# Patient Record
Sex: Male | Born: 1970 | Race: Black or African American | Hispanic: No | Smoking: Former smoker
Health system: Southern US, Community
[De-identification: ages and names within clinical notes are randomized; demographics above are authoritative.]

## PROBLEM LIST (undated history)

## (undated) ENCOUNTER — Emergency Department (HOSPITAL_COMMUNITY): Discharge status: Absent without leave | Payer: Self-pay

## (undated) ENCOUNTER — Emergency Department (HOSPITAL_COMMUNITY): Admission: EM | Payer: Medicaid Other | Source: Home / Self Care

## (undated) DIAGNOSIS — E785 Hyperlipidemia, unspecified: Secondary | ICD-10-CM

## (undated) DIAGNOSIS — I10 Essential (primary) hypertension: Secondary | ICD-10-CM

## (undated) DIAGNOSIS — F200 Paranoid schizophrenia: Secondary | ICD-10-CM

## (undated) DIAGNOSIS — H9190 Unspecified hearing loss, unspecified ear: Secondary | ICD-10-CM

## (undated) DIAGNOSIS — R519 Headache, unspecified: Secondary | ICD-10-CM

## (undated) HISTORY — DX: Headache, unspecified: R51.9

## (undated) HISTORY — DX: Essential (primary) hypertension: I10

## (undated) HISTORY — DX: Unspecified hearing loss, unspecified ear: H91.90

## (undated) HISTORY — DX: Hyperlipidemia, unspecified: E78.5

---

## 1999-08-11 ENCOUNTER — Emergency Department (HOSPITAL_COMMUNITY): Admission: EM | Admit: 1999-08-11 | Discharge: 1999-08-11 | Payer: Self-pay | Admitting: Emergency Medicine

## 2005-04-25 ENCOUNTER — Inpatient Hospital Stay (HOSPITAL_COMMUNITY): Admission: AD | Admit: 2005-04-25 | Discharge: 2005-05-05 | Payer: Self-pay | Admitting: Psychiatry

## 2005-04-26 ENCOUNTER — Ambulatory Visit: Payer: Self-pay | Admitting: Psychiatry

## 2005-09-23 ENCOUNTER — Emergency Department (HOSPITAL_COMMUNITY): Admission: EM | Admit: 2005-09-23 | Discharge: 2005-09-23 | Payer: Self-pay | Admitting: Emergency Medicine

## 2005-09-26 ENCOUNTER — Ambulatory Visit: Payer: Self-pay | Admitting: Psychiatry

## 2005-09-26 ENCOUNTER — Inpatient Hospital Stay (HOSPITAL_COMMUNITY): Admission: EM | Admit: 2005-09-26 | Discharge: 2005-09-28 | Payer: Self-pay | Admitting: Psychiatry

## 2005-10-28 ENCOUNTER — Ambulatory Visit: Payer: Self-pay | Admitting: Psychiatry

## 2005-10-28 ENCOUNTER — Inpatient Hospital Stay (HOSPITAL_COMMUNITY): Admission: RE | Admit: 2005-10-28 | Discharge: 2005-10-29 | Payer: Self-pay | Admitting: Psychiatry

## 2009-05-17 ENCOUNTER — Ambulatory Visit: Payer: Self-pay | Admitting: Internal Medicine

## 2009-08-01 ENCOUNTER — Emergency Department (HOSPITAL_COMMUNITY): Admission: EM | Admit: 2009-08-01 | Discharge: 2009-08-01 | Payer: Self-pay | Admitting: Emergency Medicine

## 2009-12-10 ENCOUNTER — Emergency Department (HOSPITAL_COMMUNITY): Admission: EM | Admit: 2009-12-10 | Discharge: 2009-12-11 | Payer: Self-pay | Admitting: Emergency Medicine

## 2009-12-11 ENCOUNTER — Inpatient Hospital Stay (HOSPITAL_COMMUNITY): Admission: RE | Admit: 2009-12-11 | Discharge: 2009-12-13 | Payer: Self-pay | Admitting: Psychiatry

## 2009-12-11 ENCOUNTER — Ambulatory Visit: Payer: Self-pay | Admitting: Psychiatry

## 2010-09-29 LAB — CBC
HCT: 46.8 % (ref 39.0–52.0)
MCV: 99.9 fL (ref 78.0–100.0)
Platelets: 237 10*3/uL (ref 150–400)
RBC: 4.68 MIL/uL (ref 4.22–5.81)
WBC: 8.6 10*3/uL (ref 4.0–10.5)

## 2010-09-29 LAB — ETHANOL: Alcohol, Ethyl (B): <5 mg/dL (ref 0–10)

## 2010-09-29 LAB — DIFFERENTIAL
Eosinophils Absolute: 0.5 10*3/uL (ref 0.0–0.7)
Eosinophils Relative: 6 % — ABNORMAL HIGH (ref 0–5)
Lymphs Abs: 1.6 10*3/uL (ref 0.7–4.0)
Monocytes Relative: 7 % (ref 3–12)

## 2010-09-29 LAB — RAPID URINE DRUG SCREEN, HOSP PERFORMED
Amphetamines: NOT DETECTED
Tetrahydrocannabinol: POSITIVE — AB

## 2010-09-29 LAB — BASIC METABOLIC PANEL
BUN: 8 mg/dL (ref 6–23)
Chloride: 98 mEq/L (ref 96–112)
Potassium: 3.7 mEq/L (ref 3.5–5.1)

## 2010-10-17 ENCOUNTER — Inpatient Hospital Stay (INDEPENDENT_AMBULATORY_CARE_PROVIDER_SITE_OTHER)
Admission: RE | Admit: 2010-10-17 | Discharge: 2010-10-17 | Disposition: A | Discharge status: Home / Self Care | Payer: Medicaid Other | Source: Ambulatory Visit | Attending: Family Medicine | Admitting: Family Medicine

## 2010-10-17 DIAGNOSIS — J069 Acute upper respiratory infection, unspecified: Secondary | ICD-10-CM
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
## 2010-11-28 NOTE — Discharge Summary (Signed)
NAMETEDD, COTTRILL NO.:  [id number]   MEDICAL RECORD NO.:  [id number]          PATIENT TYPE:  IPS   LOCATION:  0404                          FACILITY:  BH   PHYSICIAN:  Geoffery Lyons, M.D.      DATE OF BIRTH:  Dec 21, 1970   DATE OF ADMISSION:  09/26/2005  DATE OF DISCHARGE:  09/28/2005                                 DISCHARGE SUMMARY   CHIEF COMPLAINT AND PRESENT ILLNESS:  This was the first admission to [facility] for this 39 year old divorced African-American male  involuntarily committed.  He presented to the emergency department at [facility] around 6 p.m. stating that a bump on his lower lip was talking to him.  He had been seen on March 14th in the emergency department for what he was  prescribed pain medications.  At the time of the evaluation, he claimed that  he is not thinking anymore that the lip is talking to him.   PAST PSYCHIATRIC HISTORY:  Currently being followed up at [facility].   ALCOHOL/DRUG HISTORY:  Denies.   MEDICAL HISTORY:  Lesion on his lip.   MEDICATIONS:  In September of 2006, he was taking Risperdal 2 mg in the  morning, 4 at bedtime, Cogentin 1 mg twice a day and trazodone 100 mg at  bedtime.   PHYSICAL EXAMINATION:  Performed and failed to show any acute findings.   LABORATORY DATA:  Results not available in the chart.   MENTAL STATUS EXAM:  Male.  Casually dressed and groomed.  Speech was not  pressured.  Mood was anxiety.  Thought processes were clearing.  Endorsed no  longer thinking that his lip was talking to him.  Judgment and insight were  fair.  Concentration and memory were intact.  He denied any active suicidal  or homicidal ideation.   Dictation ended at this point.      Geoffery Lyons, M.D.  Electronically Signed     IL/MEDQ  D:  10/13/2005  T:  10/16/2005  Job:  [id number]

## 2010-11-28 NOTE — Discharge Summary (Signed)
[name], [name] NO.:  [id number]   MEDICAL RECORD NO.:  [id number]          PATIENT TYPE:  IPS   LOCATION:  0404                          FACILITY:  [facility]   PHYSICIAN:  Geoffery Lyons, M.D.      DATE OF BIRTH:  1970/08/02   DATE OF ADMISSION:  09/26/2005  DATE OF DISCHARGE:  09/28/2005                                 DISCHARGE SUMMARY   CHIEF COMPLAINT AND PRESENT ILLNESS:  This was the second admission to [facility] for this 40 year old divorced African-American male  who was admitted after he went to the emergency room.  He had a lower lip  lesion being treated.  He was fixated on his cold sore on his lip.  Endorsed  that he was hearing voices.  He was actively hallucinating.  He was very  labile.  The voices were sometimes good, sometimes bad.  He felt that he  was too unstable to let him go, so he was admitted to the Behavioral Health  Unit.   PAST PSYCHIATRIC HISTORY:  Diagnosed schizophrenic.  Ongoing treatment with  [facility] in [place].  Questionable compliance  with medications.   ALCOHOL/DRUG HISTORY:  Denies active use of alcohol or any other drugs.   MEDICAL HISTORY:  As already stated, lower lip lesion healing, being  treated.   MEDICATIONS:  Last prescription filled in September of 2006 which showed  Risperdal 2 mg in the morning, 4 at bedtime, Cogentin 1 mg twice a day and  trazodone 100 mg at bedtime.   PHYSICAL EXAMINATION:  Performed and failed to show any acute findings other  than scar on his lower lip.   MENTAL STATUS EXAM:  Male.  Initially drowsy.  Casually dressed and groomed.  Normal gait.  Speech was not pressured.  Mood was anxious.  Thought  processes were clearing.  No active concern about his lip.  Some  hallucinatory process.  Cognition was well-preserved.   ADMISSION DIAGNOSES:  AXIS I:  Schizophrenia, undifferentiated.  AXIS II:  No diagnosis.  AXIS III:  Lesion, lower  lip.  AXIS IV:  Moderate.  AXIS V:  GAF upon admission 35; highest GAF in the last year 60.   HOSPITAL COURSE:  He was admitted.  He was started on Seroquel and lithium  that he claimed was the medication that worked for him and he was willing to  take.  Seroquel 100 mg twice a day and at bedtime, that was gradually  increased to 100 mg twice a day and 200 mg at bedtime.  He was also given  lithium 300 mg in the morning and 600 mg at bedtime.  He admits he has been  off his medication.  Endorsed auditory hallucinations.  The voices were  telling him negative things.  Worried because he had to go to court on  Tuesday for an old charge of possession.  Worried that he would not be able  to go.  Very reserved, very guarded.  He did exhibit some threatening  behavior, wanting to get involved with different females  in the unit.  He  could be redirected.  Some females felt threatened by his approach.  Sleep  improved.  He was basically confronted with this behavior.  He denied.  On  March 19th, he was in full contact with reality.  Endorsed no  hallucinations.  Had tolerated the medications well.  We went ahead and  discharged to outpatient follow-up.   DISCHARGE DIAGNOSES:  AXIS I:  Schizoaffective disorder.  Alcohol and drug  abuse, in early partial remission.  AXIS II:  No diagnosis.  AXIS III:  No diagnosis.  AXIS IV:  Moderate.  AXIS V:  GAF upon discharge 50.   DISCHARGE MEDICATIONS:  1.  Seroquel 100 mg twice a day and 200 mg at bedtime.  2.  Lithium 300 mg in the morning and 600 mg at bedtime.  3.  Ambien 10 mg at bedtime for sleep.   FOLLOW UP:  [facility].      Geoffery Lyons, M.D.  Electronically Signed     IL/MEDQ  D:  10/13/2005  T:  10/16/2005  Job:  [id number]
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
## 2010-11-28 NOTE — H&P (Signed)
NAMESANAD, FEARNOW NO.:  [id number]   MEDICAL RECORD NO.:  [id number]          PATIENT TYPE:  IPS   LOCATION:  0404                          FACILITY:  BH   PHYSICIAN:  Jeanice Lim, M.D. DATE OF BIRTH:  1971/07/12   DATE OF ADMISSION:  04/25/2005  DATE OF DISCHARGE:                         PSYCHIATRIC ADMISSION ASSESSMENT   IDENTIFYING INFORMATION:  This is a 40 year old separated African-American  male.  He presented to the [facility] Emergency Department reporting  auditory and visual hallucinations.  He was medically cleared and admitted  to the [facility].  He is followed by Dr. Harrison Mons at [facility] in [facility].  The patient reported feeling suicidal  and homicidal due to the auditory and visual hallucinations.  His alcohol  level was 157.  He had no other drugs documented and his urine drug screen  was clean.   PAST PSYCHIATRIC HISTORY:  He has had a previous psychiatric stay at [facility] in 2003 and also in 2000 at [facility].  He is  followed as an outpatient by Marikay Alar.   SOCIAL HISTORY:  He finished high school in 1992.  He has been employed as a  Public affairs consultant, a shipping and receiving person.  He has been married once and  he has a 34-year-old son by a girlfriend.   FAMILY HISTORY:  He states his biological father had mental illness although  which type he is not sure.   ALCOHOL AND DRUG ABUSE:  He smokes all the time and denied any other drugs.  He began using alcohol at age 72.  He states he is drinking 2 to 3 beers a  day, and wine he also began using at age 3.  He drinks a cup on occasion.   PAST MEDICAL HISTORY:  He is currently prescribed Risperdal 2 mg in the  morning, 4 mg at h.s., Cogentin 1 mg b.i.d., and trazodone 100 mg at h.s. by  Dr. Harrison Mons.  The patient also purports to be taking Prozac, however when  verified and reconciled it developed that he has not been  prescribed this  since February.   ALLERGIES:  No known drug allergies.   POSITIVE PHYSICAL FINDINGS:  PHYSICAL EXAMINATION:  Reveals an African-  American male who appears his stated age of 26, in no acute distress.  His  vital signs are stable, and the remainder of his physical examination is  well documented from the ER visit.   MENTAL STATUS EXAM:  He is alert and oriented x3.  He is appropriately  groomed, dressed and adequately nourished.  His speech is not pressured.  It  is a normal rate, rhythm and tone.  His mood is appropriate to the  situation, as is his affect.  He smiles brightly today.  He states that the  voices are gone, at least for the moment.  Judgment and insight are fair,  concentration and memory are good and intelligence is average to below  average.   ADMISSION DIAGNOSES:  AXIS I:  Schizophrenia, alcohol abuse, rule out  dependence, questionable  compliance with schizophrenia medications.  AXIS II:  Deferred.  AXIS III:  None.  AXIS IV:  Moderate to severe, problems with primary support group and  housing issues.  AXIS V:  Global assessment of function is 31.   PLAN:  Plan is to re establish appropriate psychotropic medications and  compliance, to help support through withdrawal from alcohol.  Toward that  end, a low-dose Librium protocol is initiated, and his Risperdal was  restarted.      Mickie Leonarda Salon, P.A.-C.      Jeanice Lim, M.D.  Electronically Signed    MD/MEDQ  D:  04/26/2005  T:  04/26/2005  Job:  [id number]

## 2010-11-28 NOTE — H&P (Signed)
NAMESQUARE, JOWETT NO.:  [id number]   MEDICAL RECORD NO.:  [id number]          PATIENT TYPE:  IPS   LOCATION:  0404                          FACILITY:  [facility]   PHYSICIAN:  Jeanice Lim, M.D. DATE OF BIRTH:  [birth year]   DATE OF ADMISSION:  09/26/2005  DATE OF DISCHARGE:                         PSYCHIATRIC ADMISSION ASSESSMENT   IDENTIFYING INFORMATION:  This is a 40 year old divorced African-American  male.  He is here involuntarily.  Today he presented to the emergency  department at [facility] yesterday around 6 p.m. stating that a bump on his  lower lip was talking to him.  He had been seen on March 14 in the emergency  department for it and was prescribed pain medication which accounts for the  positive opiates in his urine.  Today, he is no longer delusional.  He is  not believing that his lip lesion, which is healing, is talking to him.   PAST PSYCHIATRIC HISTORY:  He is currently followed at [facility] by Willette Alma, the PA.   SOCIAL HISTORY:  He states he dropped out of high school.  He is divorced.  He has no children, and he has been getting a disability check for  approximately 6 years.   FAMILY HISTORY:  He denies.   ALCOHOL AND DRUG ABUSE:  He smokes 1 pack of cigarettes per day.  He denies  other drug use.   PAST MEDICAL HISTORY:  Primary care Chessica Audia:  He does not have one.  Medical problems:  He denies any medical problems other than this lesion  that just came up on his lip the other day.  It apparently was some kind of  a little abscess.  Medications:  We called [facility] in [facility].  He last  picked up medications in September of 2006.  At that time he had a  prescription for Risperdal 2 mg in the morning, 4 mg at h.s., Cogentin 1 mg  b.i.d., and trazodone 100 mg at h.s.   ALLERGIES:  No known drug allergies.   POSITIVE PHYSICAL FINDINGS:  PHYSICAL EXAMINATION:  His vital signs on  admission show that he  is 71 inches tall, weighs 158.  Temperature is 99.3,  blood pressure is 131/88, pulse is 72-88, and respirations are 20.  His  physical examination is remarkable for acne.  You can see a scar on his  lower lip where he had this abscess the other day.  Other than that his  physical examination is unremarkable.  His labs were unremarkable at the ED  as well.   MENTAL STATUS EXAM:  He is drowsy.  He is casually dressed and groomed.  He  had a normal gait.  His speech is not pressured.  His mood is a little bit  anxious.  Thought processes are clearing.  He is no longer delusional about  his lip at present.  Judgment and insight are fair.  Concentration and  memory are intact.  He specifically denies suicidal or homicidal ideation  and he denies auditory or visual hallucinations.   ADMISSION DIAGNOSES:  AXIS I:  Schizophrenia, noncompliant with prescribed  medications versus substance-induced mood psychosis.  AXIS II:  None.  AXIS III:  Recent lesion of lower lip.  He also has acne.  AXIS IV:  Moderate.  He reports that he has an upcoming court date this  Tuesday and Josephine Igo, the Arts administrator, is his attorney, and he is up  for drug possession charges.   PLAN:  The plan is to admit for safety and stabilization, to restart anti-  psychotic treatment as indicated, and to help him make his court date.      Mickie Leonarda Salon, P.A.-C.      Jeanice Lim, M.D.  Electronically Signed    MD/MEDQ  D:  09/26/2005  T:  09/27/2005  Job:  [id number]

## 2011-05-05 ENCOUNTER — Inpatient Hospital Stay (INDEPENDENT_AMBULATORY_CARE_PROVIDER_SITE_OTHER)
Admission: RE | Admit: 2011-05-05 | Discharge: 2011-05-05 | Disposition: A | Discharge status: Home / Self Care | Payer: Medicare Other | Source: Ambulatory Visit | Attending: Family Medicine | Admitting: Family Medicine

## 2011-05-05 ENCOUNTER — Ambulatory Visit (INDEPENDENT_AMBULATORY_CARE_PROVIDER_SITE_OTHER): Payer: PRIVATE HEALTH INSURANCE

## 2011-05-05 DIAGNOSIS — K5289 Other specified noninfective gastroenteritis and colitis: Secondary | ICD-10-CM

## 2011-05-05 LAB — DIFFERENTIAL
Basophils Absolute: 0 10*3/uL (ref 0.0–0.1)
Lymphocytes Relative: 26 % (ref 12–46)
Neutro Abs: 5.6 10*3/uL (ref 1.7–7.7)

## 2011-05-05 LAB — CBC
HCT: 44 % (ref 39.0–52.0)
Hemoglobin: 15.8 g/dL (ref 13.0–17.0)
RDW: 13 % (ref 11.5–15.5)
WBC: 9.2 10*3/uL (ref 4.0–10.5)

## 2011-05-05 LAB — POCT URINALYSIS DIP (DEVICE)
Bilirubin Urine: NEGATIVE
Glucose, UA: NEGATIVE mg/dL
Leukocytes, UA: NEGATIVE
Nitrite: NEGATIVE

## 2011-06-20 ENCOUNTER — Emergency Department (INDEPENDENT_AMBULATORY_CARE_PROVIDER_SITE_OTHER)
Admission: EM | Admit: 2011-06-20 | Discharge: 2011-06-20 | Disposition: A | Discharge status: Home / Self Care | Payer: Medicaid Other | Source: Home / Self Care | Attending: Emergency Medicine | Admitting: Emergency Medicine

## 2011-06-20 ENCOUNTER — Encounter: Payer: Self-pay | Admitting: *Deleted

## 2011-06-20 DIAGNOSIS — M6530 Trigger finger, unspecified finger: Secondary | ICD-10-CM

## 2011-06-20 HISTORY — DX: Paranoid schizophrenia: F20.0

## 2011-06-20 MED ORDER — MELOXICAM 7.5 MG PO TABS: 7.5000 mg | ORAL_TABLET | Freq: Every day | ORAL | Status: AC

## 2011-06-20 NOTE — ED Provider Notes (Addendum)
History     CSN: [id number] Arrival date & time: 06/20/2011  2:34 PM   First MD Initiated Contact with Patient 06/20/11 1458      Chief Complaint  Patient presents with  . Hand Pain    (Consider location/radiation/quality/duration/timing/severity/associated sxs/prior treatment) HPI Comments: For about 4 weeks my finger "pops" (poinst to PIP area of l middle finger), it really hurts at times and sometimes i cant move my finger"  Patient is a 40 y.o. male presenting with hand pain. The history is provided by the patient.  Hand Pain This is a recurrent problem. The current episode started more than 2 days ago. The problem occurs every several days. The problem has been gradually worsening. Exacerbated by: Moving my finger sometimes "pops" The symptoms are relieved by rest. He has tried nothing for the symptoms.    Past Medical History  Diagnosis Date  . Schizophrenia, paranoid type     No past surgical history on file.  No family history on file.  History  Substance Use Topics  . Smoking status: Current Everyday Smoker  . Smokeless tobacco: Not on file  . Alcohol Use: Yes      Review of Systems  Musculoskeletal: Positive for joint swelling.    Allergies  Review of patient's allergies indicates no known allergies.  Home Medications   Current Outpatient Rx  Name Route Sig Dispense Refill  . BENZTROPINE MESYLATE 1 MG PO TABS Oral Take 1 mg by mouth 2 (two) times daily.      Marland Kitchen RISPERIDONE 0.5 MG PO TABS Oral Take 0.5 mg by mouth 1 day or 1 dose.      Marland Kitchen RISPERIDONE MICROSPHERES 50 MG IM SUSR Intramuscular Inject 50 mg into the muscle every 14 (fourteen) days.      . MELOXICAM 7.5 MG PO TABS Oral Take 1 tablet (7.5 mg total) by mouth daily. 10 tablet 0    BP 144/76  Pulse 79  Temp(Src) 98.3 F (36.8 C) (Oral)  Resp 16  SpO2 97%  Physical Exam  Nursing note and vitals reviewed. Constitutional: He appears well-developed and well-nourished.  Musculoskeletal: He  exhibits tenderness. He exhibits no edema.       Left hand: He exhibits tenderness. He exhibits normal range of motion, no bony tenderness, normal two-point discrimination, normal capillary refill, no deformity and no swelling. normal sensation noted. Normal strength noted.       Hands: Skin: Skin is warm. No erythema.    ED Course  Procedures (including critical care time)  Labs Reviewed - No data to display No results found.   1. Trigger finger       MDM  Patient washes dishes - with trigger finger (L third finger)        Jimmie Molly, MD 06/20/11 2123  Jimmie Molly, MD 06/20/11 2124

## 2011-06-20 NOTE — ED Notes (Signed)
Pt with c/o left hand middle finger pain no known injury - onset x 2 days - worse today difficulty making fist due to pain middle finger

## 2015-08-02 ENCOUNTER — Emergency Department (HOSPITAL_COMMUNITY): Payer: Medicare Other

## 2015-08-02 ENCOUNTER — Encounter (HOSPITAL_COMMUNITY): Payer: Self-pay | Admitting: *Deleted

## 2015-08-02 ENCOUNTER — Emergency Department (HOSPITAL_COMMUNITY)
Admission: EM | Admit: 2015-08-02 | Discharge: 2015-08-03 | Disposition: A | Discharge status: Home / Self Care | Payer: Medicare Other | Attending: Emergency Medicine | Admitting: Emergency Medicine

## 2015-08-02 DIAGNOSIS — R079 Chest pain, unspecified: Secondary | ICD-10-CM | POA: Insufficient documentation

## 2015-08-02 DIAGNOSIS — Z8659 Personal history of other mental and behavioral disorders: Secondary | ICD-10-CM | POA: Diagnosis not present

## 2015-08-02 DIAGNOSIS — Z79899 Other long term (current) drug therapy: Secondary | ICD-10-CM | POA: Insufficient documentation

## 2015-08-02 DIAGNOSIS — F17200 Nicotine dependence, unspecified, uncomplicated: Secondary | ICD-10-CM | POA: Diagnosis not present

## 2015-08-02 DIAGNOSIS — R0789 Other chest pain: Secondary | ICD-10-CM | POA: Diagnosis not present

## 2015-08-02 LAB — I-STAT TROPONIN, ED: TROPONIN I, POC: 0 ng/mL (ref 0.00–0.08)

## 2015-08-02 LAB — BASIC METABOLIC PANEL
Anion gap: 13 (ref 5–15)
BUN: 12 mg/dL (ref 6–20)
CALCIUM: 9.5 mg/dL (ref 8.9–10.3)
CO2: 27 mmol/L (ref 22–32)
CREATININE: 1.06 mg/dL (ref 0.61–1.24)
Chloride: 100 mmol/L — ABNORMAL LOW (ref 101–111)
GFR calc Af Amer: >60 mL/min (ref >60)
GLUCOSE: 88 mg/dL (ref 65–99)
Potassium: 3.3 mmol/L — ABNORMAL LOW (ref 3.5–5.1)
SODIUM: 140 mmol/L (ref 135–145)

## 2015-08-02 LAB — CBC
HCT: 40.8 % (ref 39.0–52.0)
Hemoglobin: 14.4 g/dL (ref 13.0–17.0)
MCH: 33.4 pg (ref 26.0–34.0)
MCHC: 35.3 g/dL (ref 30.0–36.0)
MCV: 94.7 fL (ref 78.0–100.0)
PLATELETS: 262 10*3/uL (ref 150–400)
RBC: 4.31 MIL/uL (ref 4.22–5.81)
RDW: 13.3 % (ref 11.5–15.5)
WBC: 7.8 10*3/uL (ref 4.0–10.5)

## 2015-08-02 NOTE — ED Notes (Addendum)
Pt states he denies chest pain. States its his heart. He thinks he had two heart attacks last week and today. He thinks its from clogged arteries. Py states that he hopes it is chest pain. Denies pain but says he feels blood flowing through his veins.

## 2015-08-02 NOTE — ED Notes (Signed)
Patient transported to X-ray 

## 2015-08-02 NOTE — ED Notes (Signed)
Pt asked to describe chest pain to the writer he responded "respiratory"

## 2015-08-02 NOTE — ED Notes (Signed)
Pt states he thinks he had a heart attack last week. Pt states last week he had pain on the left side of his chest saying he "felt like his heart was about to go out." pt denies any symptoms at this time and states "I feel completely fine." Pt wants to know what happened last week so he can prevent it from happening again.

## 2015-08-03 DIAGNOSIS — R079 Chest pain, unspecified: Secondary | ICD-10-CM | POA: Diagnosis not present

## 2015-08-03 LAB — I-STAT TROPONIN, ED: TROPONIN I, POC: 0 ng/mL (ref 0.00–0.08)

## 2015-08-03 MED ORDER — OMEPRAZOLE 20 MG PO CPDR: 20.0000 mg | DELAYED_RELEASE_CAPSULE | Freq: Every day | ORAL | Status: DC

## 2015-08-03 NOTE — ED Provider Notes (Signed)
CSN: [id number]     Arrival date & time 08/02/15  2048 History  By signing my name below, I, [name], attest that this documentation has been prepared under the direction and in the presence of Jola Schmidt, MD. Electronically Signed: Altamease Hernandez, ED Scribe. 08/03/2015. 12:22 AM   Chief Complaint  Patient presents with  . Chest Pain   The history is provided by the patient. No language interpreter was used.   [name] is a 45 y.o. male with history of paranoid schizophrenia who presents to the Emergency Department complaining of intermittent, non-radiating, pressure-like chest pain with onset last week while riding the bus. The episodes last for 2-3 minutes at a time. This afternoon he had an episode of pain while washing dishes that was more severe and he thought he might fall because of it. He has not had another episode of pain since then. Pt denies SOB, nausea, vomiting, neck pain, and back pain. Pt denies history of HTN and DM. He is unsure if he has history of high cholesterol. He is a smoker. No known family history of early MI.   Past Medical History  Diagnosis Date  . Schizophrenia, paranoid type ([place])    History reviewed. No pertinent past surgical history. History reviewed. No pertinent family history. Social History  Substance Use Topics  . Smoking status: Current Every Day Smoker  . Smokeless tobacco: None  . Alcohol Use: Yes    Review of Systems 10 Systems reviewed and all are negative for acute change except as noted in the HPI. Allergies  Review of patient's allergies indicates no known allergies.  Home Medications   Prior to Admission medications   Medication Sig Start Date End Date Taking? Authorizing Provider  benztropine (COGENTIN) 1 MG tablet Take 1 mg by mouth 2 (two) times daily.     Yes Historical Provider, MD  omeprazole (PRILOSEC) 20 MG capsule Take 1 capsule (20 mg total) by mouth daily. 08/03/15   Jola Schmidt, MD   BP 159/100 mmHg   Pulse 69  Temp(Src) 97.9 F (36.6 C) (Oral)  Resp 20  SpO2 100% Physical Exam  Constitutional: He is oriented to person, place, and time. He appears well-developed and well-nourished.  HENT:  Head: Normocephalic and atraumatic.  Eyes: EOM are normal.  Neck: Normal range of motion.  Cardiovascular: Normal rate, regular rhythm, normal heart sounds and intact distal pulses.   Pulmonary/Chest: Effort normal and breath sounds normal. No respiratory distress.  Abdominal: Soft. He exhibits no distension. There is no tenderness.  Musculoskeletal: Normal range of motion.  Neurological: He is alert and oriented to person, place, and time.  Skin: Skin is warm and dry.  Psychiatric: He has a normal mood and affect. Judgment normal.  Nursing note and vitals reviewed.   ED Course  Procedures (including critical care time) DIAGNOSTIC STUDIES: Oxygen Saturation is 100% on RA,  normal by my interpretation.    COORDINATION OF CARE: 12:19 AM Discussed treatment plan which includes lab work, CXR, and EKG with pt at bedside and pt agreed to plan.  Labs Review Labs Reviewed  BASIC METABOLIC PANEL - Abnormal; Notable for the following:    Potassium 3.3 (*)    Chloride 100 (*)    All other components within normal limits  CBC  I-STAT TROPOININ, ED  Randolm Idol, ED    Imaging Review Dg Chest 2 View  08/02/2015  CLINICAL DATA:  45 year old male with chest pain EXAM: CHEST  2 VIEW COMPARISON:  None. FINDINGS: The heart size and mediastinal contours are within normal limits. Both lungs are clear. The visualized skeletal structures are unremarkable. IMPRESSION: No active cardiopulmonary disease. Electronically Signed   By: Anner Crete M.D.   On: 08/02/2015 21:40   I have personally reviewed and evaluated these images and lab results as part of my medical decision-making.   EKG Interpretation   Date/Time:  Friday August 02 2015 20:50:24 EST Ventricular Rate:  65 PR Interval:   126 QRS Duration: 90 QT Interval:  394 QTC Calculation: 409 R Axis:   72 Text Interpretation:  Normal sinus rhythm Normal ECG No significant change  was found Confirmed by Kaimen Peine  MD, Brandolyn Shortridge ([id number]) on 08/03/2015 12:10:21 AM      MDM   Final diagnoses:  Chest pain, unspecified chest pain type    Patient is overall well-appearing.  He's had intermittent chest pain for years.  This is very nonspecific.  EKG is without ischemic changes.  Troponin is negative 2.  Chest x-ray and labs without other significant abnormality.  May represent gastroesophageal reflux disease.  Patient be started on daily Prilosec.  He understands to return to the ER for new or worsening symptoms.  I recommended he obtain follow-up with a primary care physician.  Asked that he stop smoking cigarettes.  I personally performed the services described in this documentation, which was scribed in my presence. The recorded information has been reviewed and is accurate.       Jola Schmidt, MD 08/03/15 [phone]

## 2015-08-03 NOTE — Discharge Instructions (Signed)
Nonspecific Chest Pain  °Chest pain can be caused by many different conditions. There is always a chance that your pain could be related to something serious, such as a heart attack or a blood clot in your lungs. Chest pain can also be caused by conditions that are not life-threatening. If you have chest pain, it is very important to follow up with your health care provider. °CAUSES  °Chest pain can be caused by: °· Heartburn. °· Pneumonia or bronchitis. °· Anxiety or stress. °· Inflammation around your heart (pericarditis) or lung (pleuritis or pleurisy). °· A blood clot in your lung. °· A collapsed lung (pneumothorax). It can develop suddenly on its own (spontaneous pneumothorax) or from trauma to the chest. °· Shingles infection (varicella-zoster virus). °· Heart attack. °· Damage to the bones, muscles, and cartilage that make up your chest wall. This can include: °¨ Bruised bones due to injury. °¨ Strained muscles or cartilage due to frequent or repeated coughing or overwork. °¨ Fracture to one or more ribs. °¨ Sore cartilage due to inflammation (costochondritis). °RISK FACTORS  °Risk factors for chest pain may include: °· Activities that increase your risk for trauma or injury to your chest. °· Respiratory infections or conditions that cause frequent coughing. °· Medical conditions or overeating that can cause heartburn. °· Heart disease or family history of heart disease. °· Conditions or health behaviors that increase your risk of developing a blood clot. °· Having had chicken pox (varicella zoster). °SIGNS AND SYMPTOMS °Chest pain can feel like: °· Burning or tingling on the surface of your chest or deep in your chest. °· Crushing, pressure, aching, or squeezing pain. °· Dull or sharp pain that is worse when you move, cough, or take a deep breath. °· Pain that is also felt in your back, neck, shoulder, or arm, or pain that spreads to any of these areas. °Your chest pain may come and go, or it may stay  constant. °DIAGNOSIS °Lab tests or other studies may be needed to find the cause of your pain. Your health care provider may have you take a test called an ambulatory ECG (electrocardiogram). An ECG records your heartbeat patterns at the time the test is performed. You may also have other tests, such as: °· Transthoracic echocardiogram (TTE). During echocardiography, sound waves are used to create a picture of all of the heart structures and to look at how blood flows through your heart. °· Transesophageal echocardiogram (TEE). This is a more advanced imaging test that obtains images from inside your body. It allows your health care provider to see your heart in finer detail. °· Cardiac monitoring. This allows your health care provider to monitor your heart rate and rhythm in real time. °· Holter monitor. This is a portable device that records your heartbeat and can help to diagnose abnormal heartbeats. It allows your health care provider to track your heart activity for several days, if needed. °· Stress tests. These can be done through exercise or by taking medicine that makes your heart beat more quickly. °· Blood tests. °· Imaging tests. °TREATMENT  °Your treatment depends on what is causing your chest pain. Treatment may include: °· Medicines. These may include: °¨ Acid blockers for heartburn. °¨ Anti-inflammatory medicine. °¨ Pain medicine for inflammatory conditions. °¨ Antibiotic medicine, if an infection is present. °¨ Medicines to dissolve blood clots. °¨ Medicines to treat coronary artery disease. °· Supportive care for conditions that do not require medicines. This may include: °¨ Resting. °¨ Applying heat   or cold packs to injured areas. °¨ Limiting activities until pain decreases. °HOME CARE INSTRUCTIONS °· If you were prescribed an antibiotic medicine, finish it all even if you start to feel better. °· Avoid any activities that bring on chest pain. °· Do not use any tobacco products, including  cigarettes, chewing tobacco, or electronic cigarettes. If you need help quitting, ask your health care provider. °· Do not drink alcohol. °· Take medicines only as directed by your health care provider. °· Keep all follow-up visits as directed by your health care provider. This is important. This includes any further testing if your chest pain does not go away. °· If heartburn is the cause for your chest pain, you may be told to keep your head raised (elevated) while sleeping. This reduces the chance that acid will go from your stomach into your esophagus. °· Make lifestyle changes as directed by your health care provider. These may include: °¨ Getting regular exercise. Ask your health care provider to suggest some activities that are safe for you. °¨ Eating a heart-healthy diet. A registered dietitian can help you to learn healthy eating options. °¨ Maintaining a healthy weight. °¨ Managing diabetes, if necessary. °¨ Reducing stress. °SEEK MEDICAL CARE IF: °· Your chest pain does not go away after treatment. °· You have a rash with blisters on your chest. °· You have a fever. °SEEK IMMEDIATE MEDICAL CARE IF:  °· Your chest pain is worse. °· You have an increasing cough, or you cough up blood. °· You have severe abdominal pain. °· You have severe weakness. °· You faint. °· You have chills. °· You have sudden, unexplained chest discomfort. °· You have sudden, unexplained discomfort in your arms, back, neck, or jaw. °· You have shortness of breath at any time. °· You suddenly start to sweat, or your skin gets clammy. °· You feel nauseous or you vomit. °· You suddenly feel light-headed or dizzy. °· Your heart begins to beat quickly, or it feels like it is skipping beats. °These symptoms may represent a serious problem that is an emergency. Do not wait to see if the symptoms will go away. Get medical help right away. Call your local emergency services (911 in the [place] Do not drive yourself to the hospital. °  °This  information is not intended to replace advice given to you by your health care provider. Make sure you discuss any questions you have with your health care provider. °  °Document Released: 04/08/2005 Document Revised: 07/20/2014 Document Reviewed: 02/02/2014 °Elsevier Interactive Patient Education ©2016 [employer] ° °

## 2017-12-23 DIAGNOSIS — M130 Polyarthritis, unspecified: Secondary | ICD-10-CM | POA: Diagnosis not present

## 2017-12-23 DIAGNOSIS — Z833 Family history of diabetes mellitus: Secondary | ICD-10-CM | POA: Diagnosis not present

## 2017-12-29 ENCOUNTER — Other Ambulatory Visit: Payer: Self-pay | Admitting: Family Medicine

## 2017-12-29 ENCOUNTER — Ambulatory Visit
Admission: RE | Admit: 2017-12-29 | Discharge: 2017-12-29 | Disposition: A | Discharge status: Home / Self Care | Payer: PRIVATE HEALTH INSURANCE | Source: Ambulatory Visit | Attending: Family Medicine | Admitting: Family Medicine

## 2017-12-29 DIAGNOSIS — M130 Polyarthritis, unspecified: Secondary | ICD-10-CM

## 2017-12-29 DIAGNOSIS — M25562 Pain in left knee: Secondary | ICD-10-CM | POA: Diagnosis not present

## 2017-12-29 DIAGNOSIS — M25561 Pain in right knee: Secondary | ICD-10-CM | POA: Diagnosis not present

## 2017-12-30 DIAGNOSIS — E782 Mixed hyperlipidemia: Secondary | ICD-10-CM | POA: Diagnosis not present

## 2017-12-30 DIAGNOSIS — M130 Polyarthritis, unspecified: Secondary | ICD-10-CM | POA: Diagnosis not present

## 2018-01-27 DIAGNOSIS — Z23 Encounter for immunization: Secondary | ICD-10-CM | POA: Diagnosis not present

## 2018-03-25 DIAGNOSIS — E782 Mixed hyperlipidemia: Secondary | ICD-10-CM | POA: Diagnosis not present

## 2018-03-25 DIAGNOSIS — I10 Essential (primary) hypertension: Secondary | ICD-10-CM | POA: Diagnosis not present

## 2018-04-25 DIAGNOSIS — I10 Essential (primary) hypertension: Secondary | ICD-10-CM | POA: Diagnosis not present

## 2018-04-25 DIAGNOSIS — Z833 Family history of diabetes mellitus: Secondary | ICD-10-CM | POA: Diagnosis not present

## 2018-06-27 DIAGNOSIS — I10 Essential (primary) hypertension: Secondary | ICD-10-CM | POA: Diagnosis not present

## 2018-10-27 DIAGNOSIS — E782 Mixed hyperlipidemia: Secondary | ICD-10-CM | POA: Diagnosis not present

## 2018-10-27 DIAGNOSIS — I10 Essential (primary) hypertension: Secondary | ICD-10-CM | POA: Diagnosis not present

## 2018-10-27 DIAGNOSIS — M130 Polyarthritis, unspecified: Secondary | ICD-10-CM | POA: Diagnosis not present

## 2018-12-27 DIAGNOSIS — E782 Mixed hyperlipidemia: Secondary | ICD-10-CM | POA: Diagnosis not present

## 2018-12-27 DIAGNOSIS — I10 Essential (primary) hypertension: Secondary | ICD-10-CM | POA: Diagnosis not present

## 2018-12-27 DIAGNOSIS — R7309 Other abnormal glucose: Secondary | ICD-10-CM | POA: Diagnosis not present

## 2019-01-12 DIAGNOSIS — M130 Polyarthritis, unspecified: Secondary | ICD-10-CM | POA: Diagnosis not present

## 2019-01-12 DIAGNOSIS — E782 Mixed hyperlipidemia: Secondary | ICD-10-CM | POA: Diagnosis not present

## 2019-01-12 DIAGNOSIS — I10 Essential (primary) hypertension: Secondary | ICD-10-CM | POA: Diagnosis not present

## 2019-01-27 ENCOUNTER — Other Ambulatory Visit: Payer: Self-pay

## 2019-01-27 NOTE — Patient Outreach (Signed)
[facility]) Care Management  01/27/2019  [name] 1971-06-24 [id number]   Medication Adherence call to [name] [facility]ify spoke with patient he explain he is taking Rosuvastatin 10 mg daily patient explain he needs the medication and if we can call [facility] an [facility] for patint to pick up. [name] is showing past due under [place].   [facility] Direct Dial [phone]  Fax [phone] [name]

## 2019-03-15 DIAGNOSIS — I10 Essential (primary) hypertension: Secondary | ICD-10-CM | POA: Diagnosis not present

## 2019-03-15 DIAGNOSIS — E782 Mixed hyperlipidemia: Secondary | ICD-10-CM | POA: Diagnosis not present

## 2019-03-16 ENCOUNTER — Emergency Department (HOSPITAL_COMMUNITY)
Admission: EM | Admit: 2019-03-16 | Discharge: 2019-03-16 | Discharge status: Against Medical Advice | Payer: Medicare Other | Attending: Emergency Medicine | Admitting: Emergency Medicine

## 2019-03-16 ENCOUNTER — Other Ambulatory Visit: Payer: Self-pay

## 2019-03-16 DIAGNOSIS — Y999 Unspecified external cause status: Secondary | ICD-10-CM | POA: Diagnosis not present

## 2019-03-16 DIAGNOSIS — Z5321 Procedure and treatment not carried out due to patient leaving prior to being seen by health care provider: Secondary | ICD-10-CM | POA: Insufficient documentation

## 2019-03-16 DIAGNOSIS — Y929 Unspecified place or not applicable: Secondary | ICD-10-CM | POA: Insufficient documentation

## 2019-03-16 DIAGNOSIS — S50861A Insect bite (nonvenomous) of right forearm, initial encounter: Secondary | ICD-10-CM | POA: Diagnosis present

## 2019-03-16 DIAGNOSIS — Y939 Activity, unspecified: Secondary | ICD-10-CM | POA: Insufficient documentation

## 2019-03-16 DIAGNOSIS — W57XXXA Bitten or stung by nonvenomous insect and other nonvenomous arthropods, initial encounter: Secondary | ICD-10-CM | POA: Insufficient documentation

## 2019-03-16 NOTE — ED Triage Notes (Signed)
Pt noticed an insect bite on his right arm today. Pt says it is stinging. No obvious swelling or bite that can be seen.

## 2019-04-19 DIAGNOSIS — Z23 Encounter for immunization: Secondary | ICD-10-CM | POA: Diagnosis not present

## 2019-04-19 DIAGNOSIS — I10 Essential (primary) hypertension: Secondary | ICD-10-CM | POA: Diagnosis not present

## 2019-04-19 DIAGNOSIS — E782 Mixed hyperlipidemia: Secondary | ICD-10-CM | POA: Diagnosis not present

## 2019-04-19 DIAGNOSIS — Z0000 Encounter for general adult medical examination without abnormal findings: Secondary | ICD-10-CM | POA: Diagnosis not present

## 2019-07-20 DIAGNOSIS — I10 Essential (primary) hypertension: Secondary | ICD-10-CM | POA: Diagnosis not present

## 2019-10-20 DIAGNOSIS — M130 Polyarthritis, unspecified: Secondary | ICD-10-CM | POA: Diagnosis not present

## 2019-10-20 DIAGNOSIS — H918X3 Other specified hearing loss, bilateral: Secondary | ICD-10-CM | POA: Diagnosis not present

## 2019-10-20 DIAGNOSIS — J301 Allergic rhinitis due to pollen: Secondary | ICD-10-CM | POA: Diagnosis not present

## 2019-10-20 DIAGNOSIS — I10 Essential (primary) hypertension: Secondary | ICD-10-CM | POA: Diagnosis not present

## 2019-10-20 DIAGNOSIS — E782 Mixed hyperlipidemia: Secondary | ICD-10-CM | POA: Diagnosis not present

## 2019-12-04 DIAGNOSIS — H906 Mixed conductive and sensorineural hearing loss, bilateral: Secondary | ICD-10-CM | POA: Diagnosis not present

## 2019-12-04 DIAGNOSIS — H6983 Other specified disorders of Eustachian tube, bilateral: Secondary | ICD-10-CM | POA: Diagnosis not present

## 2019-12-04 DIAGNOSIS — H6533 Chronic mucoid otitis media, bilateral: Secondary | ICD-10-CM | POA: Diagnosis not present

## 2019-12-15 DIAGNOSIS — H918X3 Other specified hearing loss, bilateral: Secondary | ICD-10-CM | POA: Diagnosis not present

## 2019-12-15 DIAGNOSIS — I10 Essential (primary) hypertension: Secondary | ICD-10-CM | POA: Diagnosis not present

## 2019-12-15 DIAGNOSIS — E782 Mixed hyperlipidemia: Secondary | ICD-10-CM | POA: Diagnosis not present

## 2020-01-04 DIAGNOSIS — H6533 Chronic mucoid otitis media, bilateral: Secondary | ICD-10-CM | POA: Diagnosis not present

## 2020-03-12 DIAGNOSIS — E7849 Other hyperlipidemia: Secondary | ICD-10-CM | POA: Diagnosis not present

## 2020-03-12 DIAGNOSIS — I10 Essential (primary) hypertension: Secondary | ICD-10-CM | POA: Diagnosis not present

## 2020-04-03 DIAGNOSIS — E559 Vitamin D deficiency, unspecified: Secondary | ICD-10-CM | POA: Diagnosis not present

## 2020-04-03 DIAGNOSIS — E785 Hyperlipidemia, unspecified: Secondary | ICD-10-CM | POA: Diagnosis not present

## 2020-04-03 DIAGNOSIS — I10 Essential (primary) hypertension: Secondary | ICD-10-CM | POA: Diagnosis not present

## 2020-04-03 DIAGNOSIS — Z79899 Other long term (current) drug therapy: Secondary | ICD-10-CM | POA: Diagnosis not present

## 2020-04-15 ENCOUNTER — Emergency Department (HOSPITAL_COMMUNITY)
Admission: EM | Admit: 2020-04-15 | Discharge: 2020-04-15 | Disposition: A | Discharge status: Home / Self Care | Payer: Medicare Other | Attending: Emergency Medicine | Admitting: Emergency Medicine

## 2020-04-15 ENCOUNTER — Encounter (HOSPITAL_COMMUNITY): Payer: Self-pay

## 2020-04-15 ENCOUNTER — Other Ambulatory Visit: Payer: Self-pay

## 2020-04-15 DIAGNOSIS — Z5321 Procedure and treatment not carried out due to patient leaving prior to being seen by health care provider: Secondary | ICD-10-CM | POA: Insufficient documentation

## 2020-04-15 DIAGNOSIS — R519 Headache, unspecified: Secondary | ICD-10-CM | POA: Insufficient documentation

## 2020-04-15 DIAGNOSIS — R0981 Nasal congestion: Secondary | ICD-10-CM | POA: Diagnosis not present

## 2020-04-15 NOTE — ED Notes (Signed)
Called this pt several times for vital recheck and no answer. Pt pulled off the floor.

## 2020-04-15 NOTE — ED Triage Notes (Addendum)
Pt presents with a headache and nasal congestion x2 months. Pt reports increase nasal draining to Right nose, Left side is clear. Taking OTC meds with no relief

## 2020-06-20 ENCOUNTER — Other Ambulatory Visit: Payer: Self-pay

## 2020-06-20 ENCOUNTER — Encounter (HOSPITAL_COMMUNITY): Payer: Self-pay | Admitting: Emergency Medicine

## 2020-06-20 ENCOUNTER — Ambulatory Visit (HOSPITAL_COMMUNITY)
Admission: EM | Admit: 2020-06-20 | Discharge: 2020-06-20 | Disposition: A | Discharge status: Home / Self Care | Payer: Medicare Other

## 2020-06-20 DIAGNOSIS — J3089 Other allergic rhinitis: Secondary | ICD-10-CM | POA: Diagnosis not present

## 2020-06-20 DIAGNOSIS — R519 Headache, unspecified: Secondary | ICD-10-CM | POA: Diagnosis not present

## 2020-06-20 MED ORDER — FLUTICASONE PROPIONATE 50 MCG/ACT NA SUSP: 1.0000 | Freq: Every day | NASAL | 1 refills | Status: DC

## 2020-06-20 MED ORDER — PREDNISONE 20 MG PO TABS: 40.0000 mg | ORAL_TABLET | Freq: Every day | ORAL | 0 refills | Status: DC

## 2020-06-20 MED ORDER — CETIRIZINE HCL 10 MG PO TABS: 10.0000 mg | ORAL_TABLET | Freq: Every day | ORAL | 1 refills | Status: DC

## 2020-06-20 NOTE — ED Triage Notes (Signed)
Pt presents with headaches xs 2-3 months. States nasal congestion has been on and off xs 2-3 months. States ibuprofen and aspirin give relief for about 2 hours.

## 2020-06-20 NOTE — ED Provider Notes (Signed)
[place]    CSN: [id number] Arrival date & time: 06/20/20  1321      History   Chief Complaint Chief Complaint  Patient presents with  . Headache    HPI [name] is a 49 y.o. male.   Here today with 2-3 months of daily frontal headaches, congestion. States the headaches come and go, sometimes triggered by smoking or being outdoors. Has been treated in the past few months with antibiotics, flonase, ibuprofen which did not permanently resolve issue. Has not taken anything for sxs in over a month. Denies fever, chills, dizziness, visual changes, mental status changes, CP, SOB, N/V/D. Does have a hx of similar headaches off and on in the past. Sees PCP next week for follow up on this.      Past Medical History:  Diagnosis Date  . Schizophrenia, paranoid type ([place])     There are no problems to display for this patient.   History reviewed. No pertinent surgical history.     Home Medications    Prior to Admission medications   Medication Sig Start Date End Date Taking? Authorizing Provider  losartan-hydrochlorothiazide (HYZAAR) 50-12.5 MG tablet Take by mouth. 11/29/19  Yes [provider]  risperiDONE microspheres (RISPERDAL CONSTA) 50 MG injection Inject into the muscle. 11/16/19  Yes [provider]  atorvastatin (LIPITOR) 10 MG tablet Take 10 mg by mouth daily. 05/30/20   [provider]  benztropine (COGENTIN) 1 MG tablet Take 1 mg by mouth 2 (two) times daily.      [provider]  cetirizine (ZYRTEC ALLERGY) 10 MG tablet Take 1 tablet (10 mg total) by mouth daily. 06/20/20   Volney American, PA-C  fluticasone [facility]) 50 MCG/ACT nasal spray Place 1 spray into both nostrils daily. 06/20/20   Volney American, PA-C  montelukast (SINGULAIR) 10 MG tablet Take 10 mg by mouth daily. 05/30/20   [provider]  omeprazole (PRILOSEC) 20 MG capsule Take 1 capsule (20 mg total) by mouth daily. 08/03/15    Jola Schmidt, MD  predniSONE (DELTASONE) 20 MG tablet Take 2 tablets (40 mg total) by mouth daily with breakfast. 06/20/20   Volney American, PA-C    Family History History reviewed. No pertinent family history.  Social History Social History   Tobacco Use  . Smoking status: Current Every Day Smoker  . Smokeless tobacco: Never Used  Substance Use Topics  . Alcohol use: Yes  . Drug use: No     Allergies   Patient has no known allergies.   Review of Systems Review of Systems PER HPI    Physical Exam Triage Vital Signs ED Triage Vitals  Enc Vitals Group     BP 06/20/20 1411 (!) 144/88     Pulse Rate 06/20/20 1411 82     Resp 06/20/20 1411 18     Temp 06/20/20 1411 98.6 F (37 C)     Temp Source 06/20/20 1411 Oral     SpO2 06/20/20 1411 96 %     Weight --      Height --      Head Circumference --      Peak Flow --      Pain Score 06/20/20 1408 0     Pain Loc --      Pain Edu? --      Excl. in [place]? --    No data found.  Updated Vital Signs BP (!) 144/88 (BP Location: Right Arm)   Pulse  82   Temp 98.6 F (37 C) (Oral)   Resp 18   SpO2 96%   Visual Acuity Right Eye Distance:   Left Eye Distance:   Bilateral Distance:    Right Eye Near:   Left Eye Near:    Bilateral Near:     Physical Exam Vitals and nursing note reviewed.  Constitutional:      Appearance: Normal appearance.  HENT:     Head: Atraumatic.     Ears:     Comments: B/l middle ear effusion    Nose: Rhinorrhea present.     Comments: B/l nasal turbinates boggy and erythematous    Mouth/Throat:     Mouth: Mucous membranes are moist.     Pharynx: Posterior oropharyngeal erythema present. No oropharyngeal exudate.  Eyes:     Extraocular Movements: Extraocular movements intact.     Conjunctiva/sclera: Conjunctivae normal.     Pupils: Pupils are equal, round, and reactive to light.  Cardiovascular:     Rate and Rhythm: Normal rate and regular rhythm.  Pulmonary:     Effort:  Pulmonary effort is normal.     Breath sounds: Normal breath sounds.  Musculoskeletal:        General: Normal range of motion.     Cervical back: Normal range of motion and neck supple.  Skin:    General: Skin is warm and dry.  Neurological:     General: No focal deficit present.     Mental Status: He is oriented to person, place, and time.     Sensory: No sensory deficit.     Motor: No weakness.     Gait: Gait normal.  Psychiatric:        Mood and Affect: Mood normal.        Thought Content: Thought content normal.        Judgment: Judgment normal.      UC Treatments / Results  Labs (all labs ordered are listed, but only abnormal results are displayed) Labs Reviewed - No data to display  EKG   Radiology No results found.  Procedures Procedures (including critical care time)  Medications Ordered in UC Medications - No data to display  Initial Impression / Assessment and Plan / UC Course  I have reviewed the triage vital signs and the nursing notes.  Pertinent labs & imaging results that were available during my care of the patient were reviewed by me and considered in my medical decision making (see chart for details).     Suspect his headaches are coming from allergy inflammation, sinus congestion. Tx with prednisone burst, flonase, zyrtec regimen. Sinus rinses, humidifier, OTC pain relievers. F/u as scheduled next week with PCP for recheck. Return precautions reviewed for worsening sxs.   Final Clinical Impressions(s) / UC Diagnoses   Final diagnoses:  Sinus headache  Seasonal allergic rhinitis due to other allergic trigger   Discharge Instructions   None    ED Prescriptions    Medication Sig Dispense Auth. Provider   cetirizine (ZYRTEC ALLERGY) 10 MG tablet Take 1 tablet (10 mg total) by mouth daily. 30 tablet Volney American, PA-C   fluticasone [facility]) 50 MCG/ACT nasal spray Place 1 spray into both nostrils daily. 16 g Volney American,  [place]   predniSONE (DELTASONE) 20 MG tablet Take 2 tablets (40 mg total) by mouth daily with breakfast. 10 tablet Volney American, [place]     PDMP not reviewed this encounter.   Volney American, [place] 06/20/20 [phone]

## 2020-10-02 ENCOUNTER — Ambulatory Visit (HOSPITAL_COMMUNITY)
Admission: EM | Admit: 2020-10-02 | Discharge: 2020-10-02 | Disposition: A | Discharge status: Home / Self Care | Payer: Medicare Other

## 2020-10-02 ENCOUNTER — Other Ambulatory Visit: Payer: Self-pay

## 2020-10-02 ENCOUNTER — Encounter (HOSPITAL_COMMUNITY): Payer: Self-pay

## 2020-10-02 ENCOUNTER — Emergency Department (HOSPITAL_COMMUNITY): Payer: Medicare Other

## 2020-10-02 ENCOUNTER — Emergency Department (HOSPITAL_COMMUNITY)
Admission: EM | Admit: 2020-10-02 | Discharge: 2020-10-02 | Disposition: A | Discharge status: Home / Self Care | Payer: Medicare Other | Attending: Emergency Medicine | Admitting: Emergency Medicine

## 2020-10-02 DIAGNOSIS — G4485 Primary stabbing headache: Secondary | ICD-10-CM | POA: Diagnosis not present

## 2020-10-02 DIAGNOSIS — R519 Headache, unspecified: Secondary | ICD-10-CM | POA: Insufficient documentation

## 2020-10-02 DIAGNOSIS — Z5321 Procedure and treatment not carried out due to patient leaving prior to being seen by health care provider: Secondary | ICD-10-CM | POA: Diagnosis not present

## 2020-10-02 NOTE — ED Triage Notes (Signed)
Presents with headache and  right eye pain  since Nov 2021. Denies dizziness, vision changes.

## 2020-10-02 NOTE — Discharge Instructions (Addendum)
Please either follow up with your PCP this week to discuss work up for your headache or go to the emergency room should symptoms worsen

## 2020-10-02 NOTE — ED Triage Notes (Signed)
Patient complains of daily headache and worsening the past few days x 3 months. States that he has been treated for sinusitis but doesn't help the head pain. Alert and oriented

## 2020-10-02 NOTE — ED Provider Notes (Signed)
[place]    CSN: [id number] Arrival date & time: 10/02/20  4580      History   Chief Complaint Chief Complaint  Patient presents with  . Headache    HPI [name] is a 50 y.o. male.   HPI   Headache: Pt states that since November he has had a headache of the right side around his right eye along with eye pain.  Previously was not getting headaches. Headache feels like a stabbing throbbing pain. Worse after he smokes a cigarette. Has been taking aleve and aspirin daily for symptoms without relief. No known trauma or injury to this area. No known visual changes. He is not UTD with his eye visits but does not have a history of glaucoma. No vomiting, neck stiffness, hearing changes. He has not had symptoms evaluated yet but wishes to have a full work up including imaging today.   Past Medical History:  Diagnosis Date  . Schizophrenia, paranoid type ([place])     There are no problems to display for this patient.   History reviewed. No pertinent surgical history.     Home Medications    Prior to Admission medications   Medication Sig Start Date End Date Taking? Authorizing Provider  atorvastatin (LIPITOR) 10 MG tablet Take 10 mg by mouth daily. 05/30/20   [provider]  benztropine (COGENTIN) 1 MG tablet Take 1 mg by mouth 2 (two) times daily.      [provider]  cetirizine (ZYRTEC ALLERGY) 10 MG tablet Take 1 tablet (10 mg total) by mouth daily. 06/20/20   Volney American, PA-C  fluticasone [facility]) 50 MCG/ACT nasal spray Place 1 spray into both nostrils daily. 06/20/20   Volney American, PA-C  losartan-hydrochlorothiazide (HYZAAR) 50-12.5 MG tablet Take by mouth. 11/29/19   [provider]  montelukast (SINGULAIR) 10 MG tablet Take 10 mg by mouth daily. 05/30/20   [provider]  omeprazole (PRILOSEC) 20 MG capsule Take 1 capsule (20 mg total) by mouth daily. 08/03/15   Jola Schmidt, MD  predniSONE  (DELTASONE) 20 MG tablet Take 2 tablets (40 mg total) by mouth daily with breakfast. 06/20/20   Volney American, PA-C  risperiDONE microspheres (RISPERDAL CONSTA) 50 MG injection Inject into the muscle. 11/16/19   [provider]    Family History History reviewed. No pertinent family history.  Social History Social History   Tobacco Use  . Smoking status: Current Every Day Smoker  . Smokeless tobacco: Never Used  Substance Use Topics  . Alcohol use: Yes  . Drug use: No     Allergies   Patient has no known allergies.   Review of Systems Review of Systems  As stated above in HPI Physical Exam Triage Vital Signs ED Triage Vitals  Enc Vitals Group     BP 10/02/20 0945 (!) 156/91     Pulse Rate 10/02/20 0945 70     Resp 10/02/20 0945 (!) 23     Temp 10/02/20 0945 97.6 F (36.4 C)     Temp Source 10/02/20 0945 Oral     SpO2 10/02/20 0945 98 %     Weight --      Height --      Head Circumference --      Peak Flow --      Pain Score 10/02/20 0944 6     Pain Loc --      Pain Edu? --      Excl. in  [place]? --    No data found.  Updated Vital Signs BP (!) 156/91 (BP Location: Left Arm)   Pulse 70   Temp 97.6 F (36.4 C) (Oral)   Resp (!) 23   SpO2 98%   Physical Exam Vitals and nursing note reviewed.  Constitutional:      General: He is not in acute distress.    Appearance: He is well-developed. He is not ill-appearing, toxic-appearing or diaphoretic.  HENT:     Head: Normocephalic and atraumatic.     Mouth/Throat:     Mouth: Mucous membranes are moist.  Eyes:     General: No visual field deficit.    Extraocular Movements: Extraocular movements intact.     Right eye: Normal extraocular motion and no nystagmus.     Left eye: Normal extraocular motion and no nystagmus.     Pupils: Pupils are equal, round, and reactive to light. Pupils are equal.     Right eye: Pupil is round and reactive.     Left eye: Pupil is round and reactive.  Cardiovascular:      Rate and Rhythm: Normal rate and regular rhythm.  Pulmonary:     Breath sounds: Normal breath sounds.  Musculoskeletal:        General: Normal range of motion.     Cervical back: Normal range of motion and neck supple. No rigidity.  Skin:    General: Skin is warm.  Neurological:     Mental Status: He is alert and oriented to person, place, and time.     Cranial Nerves: No cranial nerve deficit or facial asymmetry.     Sensory: No sensory deficit.     Motor: No weakness.     Coordination: Romberg sign negative. Coordination normal.     Gait: Gait normal.  Psychiatric:        Mood and Affect: Mood normal.        Behavior: Behavior normal.      UC Treatments / Results  Labs (all labs ordered are listed, but only abnormal results are displayed) Labs Reviewed - No data to display  EKG   Radiology No results found.  Procedures Procedures (including critical care time)  Medications Ordered in UC Medications - No data to display  Initial Impression / Assessment and Plan / UC Course  I have reviewed the triage vital signs and the nursing notes.  Pertinent labs & imaging results that were available during my care of the patient were reviewed by me and considered in my medical decision making (see chart for details).     New to me. I discussed with patient that he will need further work up. Concerning symptoms as a new headache in a patient who does not have a history of headaches and who has had an intractable headache since November. Strongly suspecting abnormal findings on imaging. Discussed rebound headaches. Likely PCP can handle as he appears neurologically intact however patient would like to go to the ER to be evaluated further.    Final Clinical Impressions(s) / UC Diagnoses   Final diagnoses:  None   Discharge Instructions   None    ED Prescriptions    None     PDMP not reviewed this encounter.   Hughie Closs, [place] 10/02/20 1028

## 2020-10-02 NOTE — ED Notes (Signed)
Pt left even after I explained he was next. Pt did not want to wait.

## 2020-10-29 ENCOUNTER — Other Ambulatory Visit: Payer: Self-pay

## 2020-10-29 ENCOUNTER — Ambulatory Visit (HOSPITAL_COMMUNITY)
Admission: EM | Admit: 2020-10-29 | Discharge: 2020-10-29 | Disposition: A | Discharge status: Home / Self Care | Payer: Medicare Other | Attending: Family Medicine | Admitting: Family Medicine

## 2020-10-29 ENCOUNTER — Encounter (HOSPITAL_COMMUNITY): Payer: Self-pay

## 2020-10-29 DIAGNOSIS — B9689 Other specified bacterial agents as the cause of diseases classified elsewhere: Secondary | ICD-10-CM

## 2020-10-29 DIAGNOSIS — J0110 Acute frontal sinusitis, unspecified: Secondary | ICD-10-CM | POA: Diagnosis not present

## 2020-10-29 DIAGNOSIS — I10 Essential (primary) hypertension: Secondary | ICD-10-CM

## 2020-10-29 DIAGNOSIS — R519 Headache, unspecified: Secondary | ICD-10-CM

## 2020-10-29 DIAGNOSIS — F17210 Nicotine dependence, cigarettes, uncomplicated: Secondary | ICD-10-CM

## 2020-10-29 MED ORDER — AMOXICILLIN 875 MG PO TABS: 875.0000 mg | ORAL_TABLET | Freq: Two times a day (BID) | ORAL | 0 refills | Status: AC

## 2020-10-29 NOTE — Discharge Instructions (Signed)
Please seek prompt medical care if: You have: A very bad (severe) headache that is not helped by medicine. Trouble walking or weakness in your arms and legs. Clear or bloody fluid coming from your nose or ears. Changes in your seeing (vision). Jerky movements that you cannot control (seizure). You throw up (vomit). Your symptoms get worse. You lose balance. Your speech is slurred. You pass out. You are sleepier and have trouble staying awake. The black centers of your eyes (pupils) change in size.  These symptoms may be an emergency. Do not wait to see if the symptoms will go away. Get medical help right away. Call your local emergency services. Do not drive yourself to the hospital.  Your blood pressure was noted to be elevated during your visit today. If you are currently taking medication for high blood pressure, please ensure you are taking this as directed. If you do not have a history of high blood pressure and your blood pressure remains persistently elevated, you may need to begin taking a medication at some point. You may return here within the next few days to recheck if unable to see your primary care provider or if you do not have a one.  BP (!) 146/100   Pulse 97   Temp 98.4 F (36.9 C)   Resp 20   SpO2 96%   BP Readings from Last 3 Encounters:  10/29/20 (!) 146/100  10/02/20 (!) 156/105  10/02/20 (!) 156/91

## 2020-10-29 NOTE — ED Triage Notes (Signed)
Pt in with c/o headache that has been going on for 1 month   Pt also c/o congestion and runny nose  Pt has been taking ibuprofen and excedrin for relief

## 2020-10-29 NOTE — ED Provider Notes (Signed)
[place]   [id number] 10/29/20 Arrival Time: 5732  ASSESSMENT & PLAN:  1. Bad headache   2. Acute non-recurrent frontal sinusitis   3. Elevated blood pressure reading in office with diagnosis of hypertension   4. Cigarette smoker    Normal neurological exam. Question frontal sinus infection as potential cause of HA. Possibly tension type. Discussed. He plans f/u with PCP to further evaluate.    Discharge Instructions      Please seek prompt medical care if:  You have: ? A very bad (severe) headache that is not helped by medicine. ? Trouble walking or weakness in your arms and legs. ? Clear or bloody fluid coming from your nose or ears. ? Changes in your seeing (vision). ? Jerky movements that you cannot control (seizure).  You throw up (vomit).  Your symptoms get worse.  You lose balance.  Your speech is slurred.  You pass out.  You are sleepier and have trouble staying awake.  The black centers of your eyes (pupils) change in size.  These symptoms may be an emergency. Do not wait to see if the symptoms will go away. Get medical help right away. Call your local emergency services. Do not drive yourself to the hospital.  Your blood pressure was noted to be elevated during your visit today. If you are currently taking medication for high blood pressure, please ensure you are taking this as directed. If you do not have a history of high blood pressure and your blood pressure remains persistently elevated, you may need to begin taking a medication at some point. You may return here within the next few days to recheck if unable to see your primary care provider or if you do not have a one.  BP (!) 146/100   Pulse 97   Temp 98.4 F (36.9 C)   Resp 20   SpO2 96%   BP Readings from Last 3 Encounters:  10/29/20 (!) 146/100  10/02/20 (!) 156/105  10/02/20 (!) 156/91        Begin: Meds ordered this encounter  Medications  . amoxicillin (AMOXIL) 875  MG tablet    Sig: Take 1 tablet (875 mg total) by mouth 2 (two) times daily for 10 days.    Dispense:  20 tablet    Refill:  0     Reviewed expectations re: course of current medical issues. Questions answered. Outlined signs and symptoms indicating need for more acute intervention. Understanding verbalized. After Visit Summary given.   SUBJECTIVE: History from: patient. [name] is a 50 y.o. male who reports frontal HA following sev weeks of congestion; questions allergies. Throbbing HA when present. Not worst HA of life. No visual or hearing changes. No aphasia or ataxia. Occasional tightness in neck. No resp symptoms. No extremity sensation changes or weakness. Sleeping ok. Ibuprofen and Excedrin to resolve HA. Afebrile. Denies: difficulty breathing. Normal PO intake without n/v/d.   Borderline blood pressure noted today. Reports that he has been treated for hypertension in the past.  He reports taking medications as instructed, no chest pain on exertion, no dyspnea on exertion, no swelling of ankles, no orthostatic dizziness or lightheadedness, no orthopnea or paroxysmal nocturnal dyspnea and no palpitations.  Social History   Tobacco Use  Smoking Status Current Every Day Smoker  Smokeless Tobacco Never Used    OBJECTIVE:  Vitals:   10/29/20 1055  BP: (!) 146/100  Pulse: 97  Resp: 20  Temp: 98.4 F (36.9 C)  SpO2:  96%    General appearance: alert; no distress Eyes: PERRLA; EOMI; conjunctiva normal HENT: [place]; AT; with nasal congestion; with frontal sinus TTP Neck: supple  Lungs: speaks full sentences without difficulty; unlabored Extremities: no edema Skin: warm and dry Neurologic: normal gait; CN 2-12 grossly intact; normal extremity movement with normal strength/sensation Psychological: alert and cooperative; normal mood and affect   No Known Allergies  Past Medical History:  Diagnosis Date  . Schizophrenia, paranoid type [facility])    Social History    Socioeconomic History  . Marital status: Legally Separated    Spouse name: Not on file  . Number of children: Not on file  . Years of education: Not on file  . Highest education level: Not on file  Occupational History  . Not on file  Tobacco Use  . Smoking status: Current Every Day Smoker  . Smokeless tobacco: Never Used  Substance and Sexual Activity  . Alcohol use: Yes  . Drug use: No  . Sexual activity: Not on file  Other Topics Concern  . Not on file  Social History Narrative  . Not on file   Social Determinants of Health   Financial Resource Strain: Not on file  Food Insecurity: Not on file  Transportation Needs: Not on file  Physical Activity: Not on file  Stress: Not on file  Social Connections: Not on file  Intimate Partner Violence: Not on file   History reviewed. No pertinent family history. History reviewed. No pertinent surgical history.   Vanessa Kick, MD 10/29/20 1229

## 2020-12-30 ENCOUNTER — Other Ambulatory Visit: Payer: Self-pay

## 2020-12-30 ENCOUNTER — Ambulatory Visit (HOSPITAL_COMMUNITY)
Admission: EM | Admit: 2020-12-30 | Discharge: 2020-12-30 | Disposition: A | Discharge status: Home / Self Care | Payer: Medicare Other | Attending: Emergency Medicine | Admitting: Emergency Medicine

## 2020-12-30 ENCOUNTER — Encounter (HOSPITAL_COMMUNITY): Payer: Self-pay

## 2020-12-30 DIAGNOSIS — J329 Chronic sinusitis, unspecified: Secondary | ICD-10-CM | POA: Diagnosis not present

## 2020-12-30 DIAGNOSIS — R519 Headache, unspecified: Secondary | ICD-10-CM | POA: Diagnosis not present

## 2020-12-30 MED ORDER — KETOROLAC TROMETHAMINE 30 MG/ML IJ SOLN
30.0000 mg | Freq: Once | INTRAMUSCULAR | Status: AC
  Administered 2020-12-30: 15:00:00 30 mg via INTRAMUSCULAR

## 2020-12-30 MED ORDER — AMOXICILLIN-POT CLAVULANATE 875-125 MG PO TABS: 1.0000 | ORAL_TABLET | Freq: Two times a day (BID) | ORAL | 0 refills | Status: DC

## 2020-12-30 MED ORDER — KETOROLAC TROMETHAMINE 30 MG/ML IJ SOLN
INTRAMUSCULAR | Status: AC
  Filled 2020-12-30: qty 1

## 2020-12-30 NOTE — ED Triage Notes (Signed)
Pt presents with complaints of severe migraines on the right side of his head x 1 year. Reports he was told when they started he had a sinus infection but the pain has not stopped.

## 2020-12-30 NOTE — ED Provider Notes (Addendum)
[place]    CSN: [id number] Arrival date & time: 12/30/20  1227      History   Chief Complaint Chief Complaint  Patient presents with   Migraine    HPI [name] is a 50 y.o. male.   Patient presents with intermittent right sided headaches and ear fullness beginning upon awakening. Has been occurring as such for the last year. Denies fever, chills, congestion, rhinorrhea, sneezing, facial pain, photophobia, nausea, phonophobia, sore throat. Has had reoccurring sinus infections. Has history of allergies, current every day smoker. Smoking does worsens headaches at times.   Past Medical History:  Diagnosis Date   Schizophrenia, paranoid type ([place])     There are no problems to display for this patient.   History reviewed. No pertinent surgical history.     Home Medications    Prior to Admission medications   Medication Sig Start Date End Date Taking? Authorizing Provider  amoxicillin-clavulanate (AUGMENTIN) 875-125 MG tablet Take 1 tablet by mouth every 12 (twelve) hours. 12/30/20  Yes Whitten Andreoni R, NP  atorvastatin (LIPITOR) 10 MG tablet Take 10 mg by mouth daily. 05/30/20   [provider]  benztropine (COGENTIN) 1 MG tablet Take 1 mg by mouth 2 (two) times daily.      [provider]  cetirizine (ZYRTEC ALLERGY) 10 MG tablet Take 1 tablet (10 mg total) by mouth daily. 06/20/20   Volney American, PA-C  fluticasone [facility]) 50 MCG/ACT nasal spray Place 1 spray into both nostrils daily. 06/20/20   Volney American, PA-C  losartan-hydrochlorothiazide (HYZAAR) 50-12.5 MG tablet Take by mouth. 11/29/19   [provider]  montelukast (SINGULAIR) 10 MG tablet Take 10 mg by mouth daily. 05/30/20   [provider]  omeprazole (PRILOSEC) 20 MG capsule Take 1 capsule (20 mg total) by mouth daily. 08/03/15   Jola Schmidt, MD  predniSONE (DELTASONE) 20 MG tablet Take 2 tablets (40 mg total) by mouth daily with  breakfast. 06/20/20   Volney American, PA-C  risperiDONE microspheres (RISPERDAL CONSTA) 50 MG injection Inject into the muscle. 11/16/19   [provider]    Family History Family History  Problem Relation Age of Onset   Healthy Mother    Diabetes Father     Social History Social History   Tobacco Use   Smoking status: Every Day    Pack years: 0.00   Smokeless tobacco: Never  Substance Use Topics   Alcohol use: Yes   Drug use: No     Allergies   Patient has no known allergies.   Review of Systems Review of Systems  Constitutional: Negative.   Respiratory: Negative.    Cardiovascular: Negative.   Skin: Negative.   Neurological:  Positive for headaches. Negative for dizziness, tremors, seizures, syncope, facial asymmetry, speech difficulty, weakness, light-headedness and numbness.    Physical Exam Triage Vital Signs ED Triage Vitals [12/30/20 1432]  Enc Vitals Group     BP 139/86     Pulse Rate 65     Resp 19     Temp 98.2 F (36.8 C)     Temp src      SpO2 100 %     Weight      Height      Head Circumference      Peak Flow      Pain Score      Pain Loc      Pain Edu?      Excl. in [place]?  No data found.  Updated Vital Signs BP 139/86   Pulse 65   Temp 98.2 F (36.8 C)   Resp 19   SpO2 100%   Visual Acuity Right Eye Distance:   Left Eye Distance:   Bilateral Distance:    Right Eye Near:   Left Eye Near:    Bilateral Near:     Physical Exam Constitutional:      Appearance: Normal appearance. He is normal weight.  HENT:     Head: Normocephalic.     Right Ear: Hearing, tympanic membrane, ear canal and external ear normal.     Left Ear: Hearing, tympanic membrane, ear canal and external ear normal.     Ears:     Comments: Tympanic tube in place in right ear    Nose:     Right Turbinates: Enlarged and swollen.     Left Turbinates: Enlarged and swollen.     Right Sinus: Frontal sinus tenderness present. No maxillary sinus  tenderness.     Left Sinus: No maxillary sinus tenderness or frontal sinus tenderness.     Mouth/Throat:   Eyes:     Extraocular Movements: Extraocular movements intact.  Pulmonary:     Effort: Pulmonary effort is normal.  Musculoskeletal:        General: Normal range of motion.  Skin:    General: Skin is warm and dry.  Neurological:     Mental Status: He is alert and oriented to person, place, and time. Mental status is at baseline.  Psychiatric:        Mood and Affect: Mood normal.        Behavior: Behavior normal.     UC Treatments / Results  Labs (all labs ordered are listed, but only abnormal results are displayed) Labs Reviewed - No data to display  EKG   Radiology No results found.  Procedures Procedures (including critical care time)  Medications Ordered in UC Medications  ketorolac (TORADOL) 30 MG/ML injection 30 mg (has no administration in time range)    Initial Impression / Assessment and Plan / UC Course  I have reviewed the triage vital signs and the nursing notes.  Pertinent labs & imaging results that were available during my care of the patient were reviewed by me and considered in my medical decision making (see chart for details).  Bad headache Chronic sinusitis  Augmentin 875/125 bid for 7 days Continue use of allergy medications as prescribed Toradol 30 mg IM now ENT referral given  Final Clinical Impressions(s) / UC Diagnoses   Final diagnoses:  Bad headache  Chronic sinusitis, unspecified location     Discharge Instructions      Take antibiotic twice a day for 7 days   Continue use of allergy medications  Attempt to stop or reduce smoking, this will worsen your symptoms  Follow up with ear, nose and throat specialist for evaluation     ED Prescriptions     Medication Sig Dispense Auth. Provider   amoxicillin-clavulanate (AUGMENTIN) 875-125 MG tablet Take 1 tablet by mouth every 12 (twelve) hours. 14 tablet Natahlia Hoggard,  Leitha Schuller, NP      PDMP not reviewed this encounter.   Hans Eden, NP 12/30/20 1538    Hans Eden, NP 12/30/20 1538

## 2020-12-30 NOTE — Discharge Instructions (Addendum)
Take antibiotic twice a day for 7 days   Continue use of allergy medications  Attempt to stop or reduce smoking, this will worsen your symptoms  Follow up with ear, nose and throat specialist for evaluation

## 2021-01-27 ENCOUNTER — Encounter: Payer: Self-pay | Admitting: *Deleted

## 2021-01-27 ENCOUNTER — Other Ambulatory Visit: Payer: Self-pay | Admitting: *Deleted

## 2021-01-28 ENCOUNTER — Ambulatory Visit: Payer: Medicare Other | Admitting: Neurology

## 2021-04-22 ENCOUNTER — Other Ambulatory Visit: Payer: Self-pay

## 2021-04-22 ENCOUNTER — Encounter: Payer: Self-pay | Admitting: Neurology

## 2021-04-22 ENCOUNTER — Ambulatory Visit (INDEPENDENT_AMBULATORY_CARE_PROVIDER_SITE_OTHER): Payer: Medicare Other | Admitting: Neurology

## 2021-04-22 VITALS — BP 141/86 | HR 94 | Ht 72.0 in | Wt 191.0 lb

## 2021-04-22 DIAGNOSIS — Z79899 Other long term (current) drug therapy: Secondary | ICD-10-CM | POA: Insufficient documentation

## 2021-04-22 DIAGNOSIS — G43709 Chronic migraine without aura, not intractable, without status migrainosus: Secondary | ICD-10-CM | POA: Insufficient documentation

## 2021-04-22 MED ORDER — ONDANSETRON 4 MG PO TBDP: 4.0000 mg | ORAL_TABLET | Freq: Three times a day (TID) | ORAL | 6 refills | Status: DC | PRN

## 2021-04-22 MED ORDER — DIVALPROEX SODIUM ER 500 MG PO TB24: 500.0000 mg | ORAL_TABLET | Freq: Every day | ORAL | 11 refills | Status: DC

## 2021-04-22 MED ORDER — RIZATRIPTAN BENZOATE 10 MG PO TBDP: 10.0000 mg | ORAL_TABLET | ORAL | 6 refills | Status: DC | PRN

## 2021-04-22 NOTE — Progress Notes (Signed)
Chief Complaint  Patient presents with   New Patient (Initial Visit)    Rm 13, with peer support, pt reports daily migraines, reports sharp pain in entire head, excedrin reliefs headaches most days       ASSESSMENT AND PLAN  [name] is a 50 y.o. male  New onset headaches Paranoid schizophrenia  Normal neurological examination, normal CT head without contrast  Headache has a lot of migraine features  Start preventive medications Depakote ER 500 mg every night, I advised patient and his case manager monitor his mental health closely, if he develops any worsening symptoms, he should notify our office and to his primary care/psychiatrist office  Maxalt as needed together with Zofran, Aleve for abortive treatment  Return to clinic with nurse practitioner in 3 months   DIAGNOSTIC DATA (LABS, IMAGING, TESTING) - I reviewed patient records, labs, notes, testing and imaging myself where available.   MEDICAL HISTORY:  [name] is a 50 year old male, seen in request by his primary care physician Dr. For eval [name], Veita adduction of frequent migraine headache, he is accompanied by his case manager at today's visit 06/22/2021  I reviewed and summarized the referring note. PMHx.  He lives at home, peer support, few times a month, 3 years, [name],  HTN HLD. Paranoid Schizophrenia,    He has lifelong history of paranoid schizophrenia, under stable control taking Risperdal injection 50 mg, along with risperidone tablet 0.5 mg every night, he lives at home, works part-time job, washing dishes at [employer]  He complains of severe headache since 2021, denies a history of headache before that, his headache described as right retro-orbital area severe pain, pounding, extending to right face, lasting hours, it happened couple times each week  I personally reviewed CT head without contrast March 2022, no acute abnormality  Because of frequent headache, he has been  taking frequent over-the-counter medications, extraneous Tylenol 6 to 8 tablets daily, sometimes Excedrin Migraine 6 tablets, all ibuprofen,  He denies lateralized motor or sensory deficit,   PHYSICAL EXAM:   Vitals:   04/22/21 0754  BP: (!) 141/86  Pulse: 94  Weight: 191 lb (86.6 kg)  Height: 6' (1.829 m)   Not recorded     Body mass index is 25.9 kg/m.  PHYSICAL EXAMNIATION:  Gen: NAD, conversant, well nourised, well groomed                     Cardiovascular: Regular rate rhythm, no peripheral edema, warm, nontender. Eyes: Conjunctivae clear without exudates or hemorrhage Neck: Supple, no carotid bruits. Pulmonary: Clear to auscultation bilaterally   NEUROLOGICAL EXAM:  MENTAL STATUS: Speech/cognition:  Slow reaction, limited word of choice, cooperative, CRANIAL NERVES: CN II: Visual fields are full to confrontation. Pupils are round equal and briskly reactive to light. CN III, IV, VI: extraocular movement are normal. No ptosis. CN V: Facial sensation is intact to light touch CN VII: Face is symmetric with normal eye closure  CN VIII: Hearing is normal to causal conversation. CN IX, X: Phonation is normal. CN XI: Head turning and shoulder shrug are intact  MOTOR: There is no pronator drift of out-stretched arms. Muscle bulk and tone are normal. Muscle strength is normal.  REFLEXES: Reflexes are 2+ and symmetric at the biceps, triceps, knees, and ankles. Plantar responses are flexor.  SENSORY: Intact to light touch, pinprick and vibratory sensation are intact in fingers and toes.  COORDINATION: There is no trunk or limb dysmetria  noted.  GAIT/STANCE: Need to push-up to get up from seated position, cautious  REVIEW OF SYSTEMS:  Full 14 system review of systems performed and notable only for as above All other review of systems were negative.   ALLERGIES: No Known Allergies  HOME MEDICATIONS: Current Outpatient Medications  Medication Sig Dispense  Refill   atorvastatin (LIPITOR) 10 MG tablet Take 10 mg by mouth daily.     benztropine (COGENTIN) 1 MG tablet Take 1 mg by mouth 2 (two) times daily.       cetirizine (ZYRTEC ALLERGY) 10 MG tablet Take 1 tablet (10 mg total) by mouth daily. 30 tablet 1   fluticasone (FLONASE) 50 MCG/ACT nasal spray Place 1 spray into both nostrils daily. 16 g 1   loratadine (CLARITIN) 10 MG tablet Take 10 mg by mouth daily.     losartan-hydrochlorothiazide (HYZAAR) 50-12.5 MG tablet Take by mouth.     montelukast (SINGULAIR) 10 MG tablet Take 10 mg by mouth daily.     omeprazole (PRILOSEC) 20 MG capsule Take 1 capsule (20 mg total) by mouth daily. 30 capsule 0   risperiDONE (RISPERDAL) 0.5 MG tablet Take 0.5 mg by mouth at bedtime.     risperiDONE microspheres (RISPERDAL CONSTA) 50 MG injection Inject into the muscle.     No current facility-administered medications for this visit.    PAST MEDICAL HISTORY: Past Medical History:  Diagnosis Date   Headache    Hearing loss    Hyperlipidemia    Hypertension    Schizophrenia, paranoid type ([place])     PAST SURGICAL HISTORY: History reviewed. No pertinent surgical history.  FAMILY HISTORY: Family History  Problem Relation Age of Onset   Healthy Mother    Diabetes Father     SOCIAL HISTORY: Social History   Socioeconomic History   Marital status: Legally Separated    Spouse name: Not on file   Number of children: Not on file   Years of education: Not on file   Highest education level: Not on file  Occupational History   Not on file  Tobacco Use   Smoking status: Every Day   Smokeless tobacco: Never  Substance and Sexual Activity   Alcohol use: Yes    Comment: light   Drug use: No   Sexual activity: Not on file  Other Topics Concern   Not on file  Social History Narrative   Lives alone   Social Determinants of Health   Financial Resource Strain: Not on file  Food Insecurity: Not on file  Transportation Needs: Not on file   Physical Activity: Not on file  Stress: Not on file  Social Connections: Not on file  Intimate Partner Violence: Not on file      Marcial Pacas, M.D. Ph.D.  [facility] [address] [place], [place] [address] Ph: [phone] Fax: [phone]  CC:  Lucianne Lei, MD [address] [place],  [place] [id number]  Lucianne Lei, MD

## 2021-04-22 NOTE — Patient Instructions (Signed)
Meds ordered this encounter  Medications   divalproex (DEPAKOTE ER) 500 MG 24 hr tablet    Sig: Take 1 tablet (500 mg total) by mouth at bedtime.    Dispense:  30 tablet    Refill:  11   rizatriptan (MAXALT-MLT) 10 MG disintegrating tablet    Sig: Take 1 tablet (10 mg total) by mouth as needed for migraine. May repeat in 2 hours if needed    Dispense:  12 tablet    Refill:  6   ondansetron (ZOFRAN ODT) 4 MG disintegrating tablet    Sig: Take 1 tablet (4 mg total) by mouth every 8 (eight) hours as needed.    Dispense:  20 tablet    Refill:  6      Stop daily over-the-counter medication use to avoid medicine withdrawal migraine headaches  You can take Maxalt dissolvable as needed at the onset of migraine, if you still have headache 2 hours later, you may come by Maxalt with Aleve 2 tablets, Benadryl 1 tablet, Zofran, and sleep,

## 2021-04-25 ENCOUNTER — Other Ambulatory Visit: Payer: Self-pay

## 2021-04-25 ENCOUNTER — Emergency Department (HOSPITAL_COMMUNITY)
Admission: EM | Admit: 2021-04-25 | Discharge: 2021-04-25 | Disposition: A | Discharge status: Home / Self Care | Payer: Medicare Other | Attending: Emergency Medicine | Admitting: Emergency Medicine

## 2021-04-25 ENCOUNTER — Encounter (HOSPITAL_COMMUNITY): Payer: Self-pay

## 2021-04-25 DIAGNOSIS — I10 Essential (primary) hypertension: Secondary | ICD-10-CM | POA: Insufficient documentation

## 2021-04-25 DIAGNOSIS — F17200 Nicotine dependence, unspecified, uncomplicated: Secondary | ICD-10-CM | POA: Diagnosis not present

## 2021-04-25 DIAGNOSIS — G43009 Migraine without aura, not intractable, without status migrainosus: Secondary | ICD-10-CM | POA: Diagnosis not present

## 2021-04-25 DIAGNOSIS — R519 Headache, unspecified: Secondary | ICD-10-CM | POA: Diagnosis present

## 2021-04-25 DIAGNOSIS — Z79899 Other long term (current) drug therapy: Secondary | ICD-10-CM | POA: Diagnosis not present

## 2021-04-25 MED ORDER — NAPROXEN SODIUM 220 MG PO TABS: 220.0000 mg | ORAL_TABLET | Freq: Two times a day (BID) | ORAL | 0 refills | Status: AC | PRN

## 2021-04-25 MED ORDER — DIPHENHYDRAMINE HCL 25 MG PO TABS: 25.0000 mg | ORAL_TABLET | Freq: Four times a day (QID) | ORAL | 0 refills | Status: DC

## 2021-04-25 MED ORDER — RIZATRIPTAN BENZOATE 10 MG PO TBDP: 10.0000 mg | ORAL_TABLET | ORAL | 0 refills | Status: AC | PRN

## 2021-04-25 MED ORDER — IBUPROFEN 800 MG PO TABS
800.0000 mg | ORAL_TABLET | Freq: Once | ORAL | Status: AC
  Administered 2021-04-25: 800 mg via ORAL
  Filled 2021-04-25: qty 1

## 2021-04-25 NOTE — ED Triage Notes (Addendum)
Pt recently dx with chronic migraines. Pt states he was given rx of maxalt, depakote, and zofran . Pt states that he took too many maxalt, and that he ran out. Pt states [facility] sent him here because of taking too much of the meds. Pt states he could not get a refill.  Pt states that he almost fell asleep in the lobby.

## 2021-04-25 NOTE — ED Provider Notes (Signed)
[facility] DEPT Provider Note   CSN: [id number] Arrival date & time: 04/25/21  1218     History Chief Complaint  Patient presents with  . Headache    [name] is a 50 y.o. male hx of HTN, HL, schizophrenia, migraines, here with headache and needing refill of his maxalt.  Patient saw Dr. Krista Blue from neurology on 10/11.  Patient was prescribed Depakote and Maxalt and Zofran. Patient states that he ran out of [place].  He was prescribed 12 tablets.  He was supposed to take Aleve and Benadryl before he takes Maxalt. Instead, he just takes Maxalt and finished all 12 tablets over 3 days.  Patient states that he called his nurse and was told to come in for evaluation since he took too much Maxalt.  Patient states that he is on Medicare and Medicaid and was unable to afford Aleve and Benadryl because he has no prescription.  Denies any fevers.  Denies any worsening headaches.  He states that his headache is chronic and on the right side of his head and his eye.  The history is provided by the patient.      Past Medical History:  Diagnosis Date  . Headache   . Hearing loss   . Hyperlipidemia   . Hypertension   . Schizophrenia, paranoid type [facility])     Patient Active Problem List   Diagnosis Date Noted  . Chronic migraine w/o aura w/o status migrainosus, not intractable 04/22/2021  . Polypharmacy 04/22/2021    History reviewed. No pertinent surgical history.     Family History  Problem Relation Age of Onset  . Healthy Mother   . Diabetes Father     Social History   Tobacco Use  . Smoking status: Every Day  . Smokeless tobacco: Never  Substance Use Topics  . Alcohol use: Yes    Comment: light  . Drug use: No    Home Medications Prior to Admission medications   Medication Sig Start Date End Date Taking? Authorizing Provider  atorvastatin (LIPITOR) 10 MG tablet Take 10 mg by mouth daily. 05/30/20   [provider]  benztropine  (COGENTIN) 1 MG tablet Take 1 mg by mouth 2 (two) times daily.      [provider]  cetirizine (ZYRTEC ALLERGY) 10 MG tablet Take 1 tablet (10 mg total) by mouth daily. 06/20/20   Volney American, PA-C  divalproex (DEPAKOTE ER) 500 MG 24 hr tablet Take 1 tablet (500 mg total) by mouth at bedtime. 04/22/21   Marcial Pacas, MD  fluticasone (FLONASE) 50 MCG/ACT nasal spray Place 1 spray into both nostrils daily. 06/20/20   Volney American, PA-C  loratadine (CLARITIN) 10 MG tablet Take 10 mg by mouth daily. 11/14/20   [provider]  losartan-hydrochlorothiazide (HYZAAR) 50-12.5 MG tablet Take by mouth. 11/29/19   [provider]  montelukast (SINGULAIR) 10 MG tablet Take 10 mg by mouth daily. 05/30/20   [provider]  omeprazole (PRILOSEC) 20 MG capsule Take 1 capsule (20 mg total) by mouth daily. 08/03/15   Jola Schmidt, MD  ondansetron (ZOFRAN ODT) 4 MG disintegrating tablet Take 1 tablet (4 mg total) by mouth every 8 (eight) hours as needed. 04/22/21   Marcial Pacas, MD  risperiDONE (RISPERDAL) 0.5 MG tablet Take 0.5 mg by mouth at bedtime. 12/25/20   [provider]  risperiDONE microspheres (RISPERDAL CONSTA) 50 MG injection Inject into the muscle. 11/16/19   [provider]  rizatriptan (  MAXALT-MLT) 10 MG disintegrating tablet Take 1 tablet (10 mg total) by mouth as needed for migraine. May repeat in 2 hours if needed 04/22/21   Marcial Pacas, MD    Allergies    Patient has no known allergies.  Review of Systems   Review of Systems  Neurological:  Positive for headaches.  All other systems reviewed and are negative.  Physical Exam Updated Vital Signs BP (!) 132/99 (BP Location: Right Arm)   Pulse 92   Temp 98 F (36.7 C) (Oral)   Resp 17   SpO2 99%   Physical Exam Vitals and nursing note reviewed.  Constitutional:      Appearance: He is well-developed.  HENT:     Head: Normocephalic.     Mouth/Throat:     Mouth: Mucous  membranes are moist.  Eyes:     Extraocular Movements: Extraocular movements intact.     Pupils: Pupils are equal, round, and reactive to light.  Cardiovascular:     Rate and Rhythm: Normal rate and regular rhythm.     Heart sounds: Normal heart sounds.  Pulmonary:     Effort: Pulmonary effort is normal.     Breath sounds: Normal breath sounds.  Abdominal:     General: Bowel sounds are normal.     Palpations: Abdomen is soft.  Musculoskeletal:        General: Normal range of motion.     Cervical back: Normal range of motion and neck supple.  Skin:    General: Skin is warm.  Neurological:     Mental Status: He is alert and oriented to person, place, and time.     Cranial Nerves: No cranial nerve deficit, dysarthria or facial asymmetry.     Sensory: No sensory deficit.     Motor: No weakness.  Psychiatric:        Mood and Affect: Mood normal.        Behavior: Behavior normal.    ED Results / Procedures / Treatments   Labs (all labs ordered are listed, but only abnormal results are displayed) Labs Reviewed - No data to display  EKG None  Radiology No results found.  Procedures Procedures   Medications Ordered in ED Medications  ibuprofen (ADVIL) tablet 800 mg (800 mg Oral Given 04/25/21 1737)    ED Course  I have reviewed the triage vital signs and the nursing notes.  Pertinent labs & imaging results that were available during my care of the patient were reviewed by me and considered in my medical decision making (see chart for details).    MDM Rules/Calculators/A&P                           TEODOR Hernandez is a 50 y.o. male here with headache.  Patient has chronic headaches.  Patient finished all 12 tablets of Maxalt over 3 days.  However he did not take any NSAIDs as he was told by his neurologist.  Patient has nonfocal neuro exam and did not have any worsening headaches.  I told him that I will prescribe him some Aleve and Benadryl and he needs to take it as  prescribed before he takes Maxalt.  I do not think he took a toxic dose at this point.  We will give him another 12 tablets of Maxalt and told him to follow-up with neurology  Final Clinical Impression(s) / ED Diagnoses Final diagnoses:  None    Rx / DC  Orders ED Discharge Orders     None        Drenda Freeze, MD 04/25/21 1750

## 2021-04-25 NOTE — ED Provider Notes (Signed)
Emergency Medicine Provider Triage Evaluation Note  [name] , a 50 y.o. male  was evaluated in triage.  Pt complains of taking too much of his triptan.  Patient took 2 pills instead of 1.  Review of Systems  Positive: Headache Negative: Nausea, vomiting, visual changes  Physical Exam  BP 109/78 (BP Location: Left Arm)   Pulse (!) 107   Temp 98 F (36.7 C) (Oral)   Resp 20   SpO2 98%  Gen:   Awake, no distress   Resp:  Normal effort  MSK:   Moves extremities without difficulty  Other:  No acute distress.  PERRLA  Medical Decision Making  Medically screening exam initiated at 12:57 PM.  Appropriate orders placed.  [name] was informed that the remainder of the evaluation will be completed by another provider, this initial triage assessment does not replace that evaluation, and the importance of remaining in the ED until their evaluation is complete.     Rhae Hammock, PA-C 04/25/21 1259    Malvin Johns, MD 04/25/21 1555

## 2021-04-25 NOTE — Discharge Instructions (Addendum)
I have prescribed Aleve and Benadryl for you.  Please take it as prescribed before you take the Maxalt.  I also gave you 12 tablets of Maxalt.  Please follow-up with neurology  Return to ER if you have worse headaches, vomiting, fever, weakness, numbness

## 2021-05-13 ENCOUNTER — Other Ambulatory Visit: Payer: Self-pay | Admitting: Ophthalmology

## 2021-05-13 DIAGNOSIS — H4920 Sixth [abducent] nerve palsy, unspecified eye: Secondary | ICD-10-CM

## 2021-05-14 ENCOUNTER — Telehealth: Payer: Self-pay | Admitting: Neurology

## 2021-05-14 DIAGNOSIS — G932 Benign intracranial hypertension: Secondary | ICD-10-CM

## 2021-05-14 NOTE — Telephone Encounter (Signed)
Patient was seen on April 22, 2021 complains of increased headache, had normal CT head,  Ophthalmology evaluation by Dr. 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

## 2021-05-16 ENCOUNTER — Encounter: Payer: Self-pay | Admitting: Neurology

## 2021-05-27 NOTE — Telephone Encounter (Signed)
MRI brain scheduled 05/29/21.  LP scheduled 06/02/21  How soon after the LP would you like for him to be scheduled?

## 2021-05-27 NOTE — Telephone Encounter (Signed)
1-2 weeks would be fine.

## 2021-05-28 NOTE — Telephone Encounter (Signed)
I spoke to the patient and his follow up has been scheduled.

## 2021-05-29 ENCOUNTER — Other Ambulatory Visit: Payer: Self-pay

## 2021-05-29 ENCOUNTER — Ambulatory Visit
Admission: RE | Admit: 2021-05-29 | Discharge: 2021-05-29 | Disposition: A | Discharge status: Home / Self Care | Payer: Medicare Other | Source: Ambulatory Visit | Attending: Ophthalmology | Admitting: Ophthalmology

## 2021-05-29 DIAGNOSIS — H4920 Sixth [abducent] nerve palsy, unspecified eye: Secondary | ICD-10-CM

## 2021-05-29 MED ORDER — GADOBENATE DIMEGLUMINE 529 MG/ML IV SOLN
18.0000 mL | Freq: Once | INTRAVENOUS | Status: AC | PRN
  Administered 2021-05-29: 18 mL via INTRAVENOUS

## 2021-06-02 ENCOUNTER — Other Ambulatory Visit: Payer: Self-pay

## 2021-06-02 ENCOUNTER — Other Ambulatory Visit: Payer: Medicare Other

## 2021-06-02 ENCOUNTER — Ambulatory Visit
Admission: RE | Admit: 2021-06-02 | Discharge: 2021-06-02 | Disposition: A | Discharge status: Home / Self Care | Payer: Medicare Other | Source: Ambulatory Visit | Attending: Neurology | Admitting: Neurology

## 2021-06-02 VITALS — BP 145/94 | HR 70

## 2021-06-02 DIAGNOSIS — G932 Benign intracranial hypertension: Secondary | ICD-10-CM

## 2021-06-02 NOTE — Progress Notes (Signed)
NEUROLOGY CONSULTATION NOTE  [name] MRN: [id number] DOB: 02-20-1971  Referring provider: Monna Fam, MD Primary care provider: Lucianne Lei, MD  Reason for consult:  second opinion optic disc edema  Assessment/Plan:   Increased intracranial pressure - I suspect that the cause is secondary to the skull base tumor.  Idiopathic intracranial hypertension is possible but he is not the typical demographic (male).  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   Headaches   1  I recommend that he contact Dr. Rhea Belton office to find out about referral to a neurosurgeon. 2  In the meantime, patient's neurologist may want to consider changing from Depakote to acetazolamide or even topiramate (which can treat headache as well as intracranial hypertension)   Subjective:  [name] is a 50 year old male with HTN, HLD, and paranoid Schizophrenia who presents for second opinion regarding headaches and optic disc edema.  History supplemented by referring provider's note.  He is accompanied by his caseworker.  He started having headaches in 2021.  No prior history of headaches.  They are severe right retro-orbital/facial pounding pain.  No preceding aura.  Associated with Sometimes nausea but no vomiting, photophobia, phonophobia, visual disturbance, numbness or weakness.  They last 1 hour.  They were initially sporadic but then increased to daily.  He has had several UC or ED visits.  CT head on 10/02/2020 personally reviewed showed chronic paranasal sinus disease but otherwise unremarkable.  He followed up with neurologist, Dr. Krista Blue, in October 2022, who started him on Depakote for migraines.  He then noticed that his right eye is "crossed".  He saw ophthalmology on 05/12/2021 and was found to have right 6th nerve palsy and bilateral optic nerve edema.  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  He had an LP yesterday that revealed an opening pressure of 29 cc H2O. He states that he had a headache today but it wasn't as severe.  Double vision is unchanged.     Current NSAIDS/analgesics:  naproxen Current triptans:  rizatriptan 68m Current ergotamine:  none Current anti-emetic:  Zofran ODT 413mCurrent muscle relaxants:  none Current Antihypertensive medications:  losartan-HCTZ Current Antidepressant medications:  none Current Anticonvulsant medications:  Depakote ER 50073mHS Current anti-CGRP:  none Current Vitamins/Herbal/Supplements:  none Current Antihistamines/Decongestants:  benztropine, Benadryl, Zyrtec, Flonase, Claritin Other therapy:  none Hormone/birth control:  none Other medications:  risperidone   PAST MEDICAL HISTORY: Past Medical History:  Diagnosis Date   Headache    Hearing loss    Hyperlipidemia    Hypertension    Sch[place]ia, paranoid type ([place]   PAST SURGICAL HISTORY: No past surgical history on file.  MEDICATIONS: Current Outpatient Medications on File Prior to Visit  Medication Sig Dispense Refill   atorvastatin (LIPITOR) 10 MG tablet Take 10 mg by mouth daily.     benztropine (COGENTIN) 1 MG tablet Take 1 mg by mouth 2 (two) times daily.       cetirizine (ZYRTEC ALLERGY) 10 MG tablet Take 1 tablet (10 mg total) by mouth daily. 30 tablet 1   diphenhydrAMINE (BENADRYL) 25 MG tablet Take 1 tablet (25 mg total) by mouth every 6 (six) hours. 30 tablet 0  divalproex (DEPAKOTE ER) 500 MG 24 hr tablet Take 1 tablet (500 mg total) by mouth at bedtime. 30 tablet 11   fluticasone (FLONASE) 50 MCG/ACT nasal spray Place 1 spray into both nostrils daily. 16 g 1   loratadine (CLARITIN) 10 MG tablet Take 10 mg by mouth daily.      losartan-hydrochlorothiazide (HYZAAR) 50-12.5 MG tablet Take by mouth.     montelukast (SINGULAIR) 10 MG tablet Take 10 mg by mouth daily.     naproxen sodium (ALEVE) 220 MG tablet Take 1 tablet (220 mg total) by mouth 2 (two) times daily as needed. 60 tablet 0   omeprazole (PRILOSEC) 20 MG capsule Take 1 capsule (20 mg total) by mouth daily. 30 capsule 0   ondansetron (ZOFRAN ODT) 4 MG disintegrating tablet Take 1 tablet (4 mg total) by mouth every 8 (eight) hours as needed. 20 tablet 6   risperiDONE (RISPERDAL) 0.5 MG tablet Take 0.5 mg by mouth at bedtime.     risperiDONE microspheres (RISPERDAL CONSTA) 50 MG injection Inject into the muscle.     rizatriptan (MAXALT-MLT) 10 MG disintegrating tablet Take 1 tablet (10 mg total) by mouth as needed for migraine. May repeat in 2 hours if needed 12 tablet 0   No current facility-administered medications on file prior to visit.    ALLERGIES: No Known Allergies  FAMILY HISTORY: Family History  Problem Relation Age of Onset   Healthy Mother    Diabetes Father     Objective:  Blood pressure 94/67, pulse (!) 110, height 6' (1.829 m), weight 199 lb 6.4 oz (90.4 kg), SpO2 96 %. General: No acute distress.  Patient appears well-groomed.   Head:  Normocephalic/atraumatic Eyes:  fundi examined but not visualized Neck: supple, no paraspinal tenderness, full range of motion Back: No paraspinal tenderness Heart: regular rate and rhythm Lungs: Clear to auscultation bilaterally. Vascular: No carotid bruits. Neurological Exam: Mental status: alert and oriented to person, place, and time, recent and remote memory intact, fund of knowledge intact, attention and concentration intact, speech fluent and not dysarthric, language intact. Cranial nerves: CN I: not tested CN II: pupils equal, round and reactive to light, visual fields intact CN III, IV, VI: Right sixth nerve palsy, no nystagmus, no ptosis CN V: facial sensation intact. CN VII: upper and  lower face symmetric CN VIII: hearing intact CN IX, X: gag intact, uvula midline CN XI: sternocleidomastoid and trapezius muscles intact CN XII: tongue midline Bulk & Tone: normal, no fasciculations. Motor:  muscle strength 5/5 throughout Sensation:  Pinprick, temperature and vibratory sensation intact. Deep Tendon Reflexes:  2+ throughout,  toes downgoing.   Finger to nose testing:  Without dysmetria.   Heel to shin:  Without dysmetria.   Gait:  Normal station and stride.  Romberg negative.    Thank you for allowing me to take part in the care of this patient.  Metta Clines, DO  CC:  Lucianne Lei, MD  Monna Fam, MD

## 2021-06-02 NOTE — Discharge Instructions (Signed)

## 2021-06-03 ENCOUNTER — Encounter: Payer: Self-pay | Admitting: Neurology

## 2021-06-03 ENCOUNTER — Ambulatory Visit (INDEPENDENT_AMBULATORY_CARE_PROVIDER_SITE_OTHER): Payer: Medicare Other | Admitting: Neurology

## 2021-06-03 VITALS — BP 94/67 | HR 110 | Ht 72.0 in | Wt 199.4 lb

## 2021-06-03 DIAGNOSIS — D496 Neoplasm of unspecified behavior of brain: Secondary | ICD-10-CM

## 2021-06-03 DIAGNOSIS — G932 Benign intracranial hypertension: Secondary | ICD-10-CM | POA: Diagnosis not present

## 2021-06-03 DIAGNOSIS — H4921 Sixth [abducent] nerve palsy, right eye: Secondary | ICD-10-CM

## 2021-06-03 NOTE — Patient Instructions (Signed)
I would recommend contacting Dr. Rhea Belton office to find out the next step and whether you need to be referred to a surgeon regarding the tumor In the interim, you can follow up with Dr. Krista Blue to discuss medications used to treat headache as well as increased intracranial pressure - topiramate or acetazolamide

## 2021-06-16 ENCOUNTER — Ambulatory Visit (INDEPENDENT_AMBULATORY_CARE_PROVIDER_SITE_OTHER): Payer: Medicare Other | Admitting: Neurology

## 2021-06-16 ENCOUNTER — Encounter: Payer: Self-pay | Admitting: Neurology

## 2021-06-16 ENCOUNTER — Telehealth: Payer: Self-pay | Admitting: Neurology

## 2021-06-16 VITALS — BP 141/98 | HR 74 | Ht 72.0 in | Wt 204.5 lb

## 2021-06-16 DIAGNOSIS — S0440XA Injury of abducent nerve, unspecified side, initial encounter: Secondary | ICD-10-CM | POA: Insufficient documentation

## 2021-06-16 DIAGNOSIS — D496 Neoplasm of unspecified behavior of brain: Secondary | ICD-10-CM | POA: Diagnosis not present

## 2021-06-16 DIAGNOSIS — G932 Benign intracranial hypertension: Secondary | ICD-10-CM

## 2021-06-16 DIAGNOSIS — S0440XS Injury of abducent nerve, unspecified side, sequela: Secondary | ICD-10-CM

## 2021-06-16 NOTE — Progress Notes (Addendum)
Chief Complaint  Patient presents with   Follow-up    Rm 15. Accompanied by mother. PCP is Dr. Lucianne Lei. Follow up for test results (MRI brain, LP).     ASSESSMENT AND PLAN  [name] is a 50 y.o. male  Skull base tumor,  7 cm, involving central skull base, regional bone structure destruction including diffuse involvement of clivus, with tumor also extending inferiorly and laterally into the occipital condyles tumor extends superiorly into the sphenoid sinus, feels nasopharyngeal with extension into the posterior aspect of the nasal cavity, a component of right cavernous sinus measuring 2.8 x 2.3 cm, bulging laterally into the right medial cranial fossa, there is involvement of multiple skull base foraminal, as well as asymmetric feeling of the right Meckel's cave, the pituitary gland is separate from the mass,  Evidence of intracranial hypertension bilateral 6th nerve palsy  CT angiogram of the brain with without contrast/CT head with without contrast to further delineate characterize the lesion  Stat referral to neurosurgical clinic, extensive discussion with his mother and patient, local surgical referral to have quick access, mother also want to refer to [facility] Dr. Angelene Giovanni  Return to clinic in 3 to 4 weeks  DIAGNOSTIC DATA (LABS, IMAGING, TESTING) - I reviewed patient records, labs, notes, testing and imaging myself where available.   MEDICAL HISTORY:  [name] is a 50 year old male, seen in request by his primary care physician Dr. Criss Rosales, Veita adduction of frequent migraine headache, he is accompanied by his case manager Sunny Schlein, who has known him for 3 years at today's visit on Oct/05/2021  I reviewed and summarized the referring note. PMHx. HTN HLD. Paranoid Schizophrenia,    He has lifelong history of paranoid schizophrenia, under stable control taking Risperdal injection 50 mg, along with risperidone tablet 0.5 mg every  night, he lives at home, works part-time job, washing dishes at [employer]  He complains of severe headache since 2021, denies a history of headache before that, his headache described as right retro-orbital area severe pain, pounding, extending to right face, lasting hours, it happened couple times each week  I personally reviewed CT head without contrast March 2022, no acute abnormality  Because of frequent headache, he has been taking frequent over-the-counter medications, extraneous Tylenol 6 to 8 tablets daily, sometimes Excedrin Migraine 6 tablets,or  ibuprofen,  He denies lateralized motor or sensory deficit, denies double vision   UPDATE Dec 5th 2022: I reviewed ophthalmology evaluation on May 15, 2019 improved, by Dr. Herbert Deaner on May 12, 2021, which showed bilateral optic disc edema  I have put the emergency fluoroscopy guided lumbar puncture also ordered MRI of the brain with without contrast  MRI of the brain with and without contrast on May 29, 2021 reported by Dr. Logan Bores showed 7 cm central skull base tumor, consideration included but not limited to meningioma, lymphoma, plasmacytoma, nasopharyngeal/sinonasal carcinoma,  Patient had fluoroscopy guided lumbar puncture on June 02, 2021, showed significantly elevated opening pressure 48 cmH2O, spinal fluid testing showed no significant abnormality  Patient is a poor historian, today he relies on his mother to talk, mother reported that for 3 weeks she was not able to talk with patient face-to-face, in late November, when she saw patient finally, she noticed patient has crossed eyes, on further questioning, patient reported that he has developed double vision at the end of October, seems to gradually getting worse, he was started on Depakote ER 500 mg since last  visit on April 22, 2021, which has helped his headache  Patient was referred by ophthalmologist Dr. Herbert Deaner to neurologist Dr. Forest Gleason who saw patient  on June 03, 2021, documented double vision, right 6th nerve palsy, who suggested patient continue follow-up with our clinic  Patient's mother reported that he seems to have worsening abnormal eye movement, patient could not provide clear history, he denies significant headache now, able to continue to work his part-time job at [employer],    [place]:   Vitals:   06/16/21 1541  BP: (!) 141/98  Pulse: 74  Weight: 204 lb 8 oz (92.8 kg)  Height: 6' (1.829 m)   Not recorded     Body mass index is 27.74 kg/m.  PHYSICAL EXAMNIATION:  Gen: NAD, conversant, well nourised, well groomed                     Cardiovascular: Regular rate rhythm, no peripheral edema, warm, nontender. Eyes: Conjunctivae clear without exudates or hemorrhage Neck: Supple, no carotid bruits. Pulmonary: Clear to auscultation bilaterally   NEUROLOGICAL EXAM:  MENTAL STATUS: Speech/cognition:  Slow reaction, limited word of choice, cooperative, CRANIAL NERVES: CN II: Visual fields are full to confrontation. Pupils are round equal and briskly reactive to light. CN III, IV, VI: Bilateral abductor weakness, CN V: Facial sensation is intact to light touch CN VII: Face is symmetric with normal eye closure  CN VIII: Hearing is normal to causal conversation. CN IX, X: Phonation is normal. CN XI: Head turning and shoulder shrug are intact  MOTOR: There is no pronator drift of out-stretched arms. Muscle bulk and tone are normal. Muscle strength is normal.  REFLEXES: Reflexes are 2+ and symmetric at the biceps, triceps, knees, and ankles. Plantar responses are flexor.  SENSORY: Intact to light touch, pinprick and vibratory sensation are intact in fingers and toes.  COORDINATION: There is no trunk or limb dysmetria noted.  GAIT/STANCE: Able to get up from seated position, cautious gait gait, skull-based tends to close 1 eye while ambulating,  REVIEW OF SYSTEMS:  Full 14 system review of systems  performed and notable only for as above All other review of systems were negative.   ALLERGIES: No Known Allergies  HOME MEDICATIONS: Current Outpatient Medications  Medication Sig Dispense Refill   atorvastatin (LIPITOR) 10 MG tablet Take 10 mg by mouth daily.     benztropine (COGENTIN) 1 MG tablet Take 1 mg by mouth 2 (two) times daily.       cetirizine (ZYRTEC ALLERGY) 10 MG tablet Take 1 tablet (10 mg total) by mouth daily. 30 tablet 1   diphenhydrAMINE (BENADRYL) 25 MG tablet Take 1 tablet (25 mg total) by mouth every 6 (six) hours. 30 tablet 0   divalproex (DEPAKOTE ER) 500 MG 24 hr tablet Take 1 tablet (500 mg total) by mouth at bedtime. 30 tablet 11   fluticasone (FLONASE) 50 MCG/ACT nasal spray Place 1 spray into both nostrils daily. 16 g 1   loratadine (CLARITIN) 10 MG tablet Take 10 mg by mouth daily.     losartan-hydrochlorothiazide (HYZAAR) 50-12.5 MG tablet Take by mouth.     montelukast (SINGULAIR) 10 MG tablet Take 10 mg by mouth daily.     naproxen sodium (ALEVE) 220 MG tablet Take 1 tablet (220 mg total) by mouth 2 (two) times daily as needed. 60 tablet 0   omeprazole (PRILOSEC) 20 MG capsule Take 1 capsule (20 mg total) by mouth daily. 30 capsule 0  ondansetron (ZOFRAN ODT) 4 MG disintegrating tablet Take 1 tablet (4 mg total) by mouth every 8 (eight) hours as needed. 20 tablet 6   risperiDONE (RISPERDAL) 0.5 MG tablet Take 0.5 mg by mouth at bedtime.     risperiDONE microspheres (RISPERDAL CONSTA) 50 MG injection Inject into the muscle.     rizatriptan (MAXALT-MLT) 10 MG disintegrating tablet Take 1 tablet (10 mg total) by mouth as needed for migraine. May repeat in 2 hours if needed 12 tablet 0   No current facility-administered medications for this visit.    PAST MEDICAL HISTORY: Past Medical History:  Diagnosis Date   Headache    Hearing loss    Hyperlipidemia    Hypertension    Schizophrenia, paranoid type ([place])     PAST SURGICAL HISTORY: History  reviewed. No pertinent surgical history.  FAMILY HISTORY: Family History  Problem Relation Age of Onset   Healthy Mother    Diabetes Father    Stroke Maternal Uncle     SOCIAL HISTORY: Social History   Socioeconomic History   Marital status: Legally Separated    Spouse name: Not on file   Number of children: Not on file   Years of education: Not on file   Highest education level: Not on file  Occupational History   Not on file  Tobacco Use   Smoking status: Every Day    Packs/day: 1.00    Types: Cigarettes   Smokeless tobacco: Never  Substance and Sexual Activity   Alcohol use: Yes    Comment: light   Drug use: No   Sexual activity: Not on file  Other Topics Concern   Not on file  Social History Narrative   Lives alone   Right handed   Social Determinants of Health   Financial Resource Strain: Not on file  Food Insecurity: Not on file  Transportation Needs: Not on file  Physical Activity: Not on file  Stress: Not on file  Social Connections: Not on file  Intimate Partner Violence: Not on file    Total time spent reviewing the chart, obtaining history, examined patient, ordering tests, documentation, consultations and family, care coordination was 70 minutes   Addendum: [facility] neurosurgeon evaluation by Dr. Yetta Glassman on June 24, 2021, 6th nerve palsy to right, full visual field to confrontation, MRI scan extensive ablation of his clivus, not Multicyst,  pituitary gland looks normal,  Going to get blood and urine test for plasmacytoma, complete blood count with differentiation to be certain he does not have lymphoma or leukemia,  Bone scan to see if there are any other lesion,  CT scan of the chest and abdomen  The lesion looks very extensive, like to have more information before any intervention Marcial Pacas, M.D. Ph.D.  [facility] [address] [place], [place] [address] Ph: [phone] Fax: [phone]  CC:  Lucianne Lei, MD [address] [place],  [place] [address]  Lucianne Lei, MD

## 2021-06-16 NOTE — Telephone Encounter (Signed)
I spoke to the patient. He is agreeable to the plan below. He has a pending appt scheduled here on 07/08/21. He knows to expect a call for the other locations and will keep his phone close.

## 2021-06-16 NOTE — Patient Instructions (Signed)
[facility] [address] [place], [place] [address] PHONE [phone]   Bremer Salomon Fick, MD, PhD Address: [facility] 4th Floor, [place], [place], [place] [address] Phone: [phone]

## 2021-06-16 NOTE — Telephone Encounter (Addendum)
Stat neurosurgeon to [facility]  And also stat refer to Dr. Angelene Giovanni at [facility]  Stat CT head with without contrast, stat CT angiogram of the brain with without contrast  Also give him a follow-up appointment at the end of December 2022

## 2021-06-17 ENCOUNTER — Telehealth: Payer: Self-pay | Admitting: Neurology

## 2021-06-17 NOTE — Telephone Encounter (Signed)
Patient is scheduled at [facility] for this Thursday 06/19/21 to see Dr. Zada Finders at 3 pm. Do you still want the referral to be sent to [facility]?

## 2021-06-17 NOTE — Addendum Note (Signed)
Addended by: Marcial Pacas on: 06/17/2021 09:56 AM   Modules accepted: Orders

## 2021-06-17 NOTE — Telephone Encounter (Signed)
[employer] medicare/medicaid no auth req. Sent a message to Windy Carina and Prentiss Bells at [facility] to reach out to the patient as soon as possible to schedule the CT.

## 2021-06-17 NOTE — Telephone Encounter (Signed)
I sent the referral to [facility] as urgent on proficient..  I don't see a referral put in for Dr. Angelene Giovanni at [facility]?

## 2021-06-17 NOTE — Telephone Encounter (Signed)
Noted, I called [facility] and I spoke with Judson Roch she informed me that I faxed the order and they will review and if they agree to it they will reach out to the patient to schedule.  I faxed the referral to [facility] ph # [phone] & fax [phone]. I did mark it urgent.

## 2021-06-17 NOTE — Telephone Encounter (Signed)
I put another neurosurgeon order to Dr. Angelene Giovanni at [facility], make it stat  If he can find appointment date for him by phone, that would be great, both patient and his mom needs some help,

## 2021-06-17 NOTE — Telephone Encounter (Signed)
The Ct's are also scheduled for tomorrow 06/18/21 at [place].

## 2021-06-18 ENCOUNTER — Telehealth: Payer: Self-pay | Admitting: Neurology

## 2021-06-18 ENCOUNTER — Ambulatory Visit
Admission: RE | Admit: 2021-06-18 | Discharge: 2021-06-18 | Disposition: A | Discharge status: Home / Self Care | Payer: Medicare Other | Source: Ambulatory Visit | Attending: Neurology | Admitting: Neurology

## 2021-06-18 ENCOUNTER — Inpatient Hospital Stay: Admission: RE | Admit: 2021-06-18 | Payer: Medicare Other | Source: Ambulatory Visit

## 2021-06-18 DIAGNOSIS — G932 Benign intracranial hypertension: Secondary | ICD-10-CM

## 2021-06-18 DIAGNOSIS — S0440XS Injury of abducent nerve, unspecified side, sequela: Secondary | ICD-10-CM

## 2021-06-18 DIAGNOSIS — D496 Neoplasm of unspecified behavior of brain: Secondary | ICD-10-CM

## 2021-06-18 MED ORDER — IOPAMIDOL (ISOVUE-300) INJECTION 61%
75.0000 mL | Freq: Once | INTRAVENOUS | Status: AC | PRN
  Administered 2021-06-18: 75 mL via INTRAVENOUS

## 2021-06-18 NOTE — Telephone Encounter (Signed)
I reviewed ophthalmology evaluation by Dr.Chaunhi Lucianne Lei on June 12, 2021, OD VI nerve palsy, optic nerve edema,  Patient was first seen October 31 with OD 6th nerve palsy, optic disc edema OD, he was referred to Dr. Merry Proud, [facility] neurologist, patient was also referred to [facility] neurosurgeon,  May 12, 2021, optic nerve edema OU, consistent with elevated intracranial pressure,  He did have lumbar puncture June 02, 2021, with opening pressure of 48 cmH2O, spinal fluid testing showed no significant abnormality, glucose 62, total protein of 42, WBC 1  Retinal vascular attenuation OU, dry eye syndrome,  Urgent referral to [facility] on June 16, 2021, they will reach out to patient directly, appointment with a local neurosurgeon Dr. Venetia Constable on  June 19, 2021,  CT head with without contrast and CT angiogram of the head on December 7

## 2021-06-19 ENCOUNTER — Telehealth: Payer: Self-pay | Admitting: Neurology

## 2021-06-19 DIAGNOSIS — D496 Neoplasm of unspecified behavior of brain: Secondary | ICD-10-CM

## 2021-06-19 DIAGNOSIS — J392 Other diseases of pharynx: Secondary | ICD-10-CM | POA: Insufficient documentation

## 2021-06-19 NOTE — Telephone Encounter (Signed)
Noted, sent to [facility] ph # [phone]

## 2021-06-19 NOTE — Telephone Encounter (Signed)
I have called his mother CT head w/wo and CTA, there is posterior nasopharynx extension.  He has neurosurgery appointment with Dr. Zada Finders at [facility] on Dec 8th 2022.  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 Tumor extension into the sphenoid and posterior ethmoid sinuses. Tumor extension into the jugular foramina regions without visible flow in the jugular veins. Internal carotid artery displacement and partial encasement beneath the skull base and encasement in the cavernous sinus regions.

## 2021-06-26 ENCOUNTER — Telehealth: Payer: Self-pay | Admitting: Neurology

## 2021-06-26 NOTE — Telephone Encounter (Signed)
He was seen by neurosurgeon Dr. Derrill Memo at [facility] on June 24, 2021,  Please check with patient and his mother about the treatment plan,  We also previously referred him to ENT, and [facility] for posterior fossa tumor, check on whether he was seen by them or not

## 2021-06-27 NOTE — Telephone Encounter (Signed)
Also, spoke to this mother who confirmed this was the plan.

## 2021-06-27 NOTE — Telephone Encounter (Addendum)
I spoke to the patient. States he has a planned CT chest/abdomen/pelvis and bone scan at [facility] on 07/04/21.   Says he was told the mass is too large to remove. The plan is to start some type of treatment that will be determined after the above tests have been completed.  He has not seen ENT at [facility]. He plans to keep all his care at [facility].   He will keep his pending appt w/ Dr. Krista Blue on 07/08/21.

## 2021-07-01 ENCOUNTER — Telehealth: Payer: Self-pay | Admitting: *Deleted

## 2021-07-01 LAB — PROTEIN, CSF: Total Protein, CSF: 42 mg/dL (ref 15–45)

## 2021-07-01 LAB — FUNGUS CULTURE W SMEAR
CULTURE:: NO GROWTH
MICRO NUMBER:: 12663699
SMEAR:: NONE SEEN
SPECIMEN QUALITY:: ADEQUATE

## 2021-07-01 LAB — CSF CELL COUNT WITH DIFFERENTIAL
RBC Count, CSF: 0 cells/uL
WBC, CSF: 1 cells/uL (ref 0–5)

## 2021-07-01 LAB — GRAM STAIN
MICRO NUMBER:: 12663698
SPECIMEN QUALITY:: ADEQUATE

## 2021-07-01 LAB — GLUCOSE, CSF: Glucose, CSF: 62 mg/dL (ref 40–80)

## 2021-07-01 LAB — VDRL, CSF: VDRL Quant, CSF: NONREACTIVE

## 2021-07-01 NOTE — Telephone Encounter (Signed)
I spoke to the patient. He has been informed of the lab results of his CSF fluid (LP on 06/02/21).

## 2021-07-01 NOTE — Telephone Encounter (Signed)
-----   Message from Melvenia Beam, MD sent at 07/01/2021  1:22 PM EST ----- Results so far normal thanks

## 2021-07-08 ENCOUNTER — Other Ambulatory Visit: Payer: Self-pay

## 2021-07-08 ENCOUNTER — Ambulatory Visit (INDEPENDENT_AMBULATORY_CARE_PROVIDER_SITE_OTHER): Payer: Medicare Other | Admitting: Neurology

## 2021-07-08 ENCOUNTER — Encounter: Payer: Self-pay | Admitting: Neurology

## 2021-07-08 VITALS — BP 129/93 | HR 102 | Ht 72.0 in | Wt 200.5 lb

## 2021-07-08 DIAGNOSIS — G932 Benign intracranial hypertension: Secondary | ICD-10-CM | POA: Diagnosis not present

## 2021-07-08 DIAGNOSIS — D496 Neoplasm of unspecified behavior of brain: Secondary | ICD-10-CM

## 2021-07-08 DIAGNOSIS — J392 Other diseases of pharynx: Secondary | ICD-10-CM | POA: Diagnosis not present

## 2021-07-08 NOTE — Progress Notes (Signed)
Chief Complaint  Patient presents with   Follow-up    Rm 14. PCP is Dr. Lucianne Lei currently. His PCP will change next week. Accompanied by mom. Follow up visit for intracranial tumor.     ASSESSMENT AND PLAN  [name] is a 50 y.o. male  Skull base tumor,  7 cm, involving central skull base, regional bone structure destruction including diffuse involvement of clivus, with tumor also extending inferiorly and laterally into the occipital condyles tumor extends superiorly into the sphenoid sinus, feels nasopharyngeal with extension into the posterior aspect of the nasal cavity, a component of right cavernous sinus measuring 2.8 x 2.3 cm, bulging laterally into the right medial cranial fossa, there is involvement of multiple skull base foraminal, as well as asymmetric feeling of the right Meckel's cave, the pituitary gland is separate from the mass,  Evidence of intracranial 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 Tommi Rumps, will continue treatment there  DIAGNOSTIC DATA (LABS, IMAGING, TESTING) - I reviewed patient records, labs, notes, testing and imaging myself where available.   MEDICAL HISTORY:  [name] is a 50 year old male, seen in request by his primary care physician Dr. Criss Rosales, Veita adduction of frequent migraine headache, he is accompanied by his case manager Sunny Schlein, who has known him for 3 years at today's visit  on Oct/05/2021  I reviewed and summarized the referring note. PMHx. HTN HLD.  If you miss an appointment you reschedule another appointment for subjective chart 4 years and and no daycare attendance parents.  She had 15 x-rays.  And to get up 15 g right and so But at today daily gait instability: Alert colonoscope canceled appointment which is something that should go because if there is no appointment scheduled date: Take up to 5 L O2 he had a COVID completed 2:00 today consented will need appointment still aimovig will send an appointment of some neck no prescription  Helpful  Reviewed follow-up with headaches earlier today.  Okay Continue adjunctively having any significant regularity neck: Respiratory: Clear:, Ongoing  Most of the time.   Paranoid Schizophrenia,    He has lifelong history of paranoid schizophrenia, under stable control taking Risperdal injection 50 mg, along with risperidone tablet 0.5 mg every night, he lives at home, works part-time job, washing dishes at [employer]  He complains of severe headache since 2021, denies a history of headache before that, his headache described as right retro-orbital area severe pain, pounding, extending to right face, lasting hours, it happened couple times each week  I personally reviewed CT head without contrast March 2022, no acute abnormality  Because of frequent headache, he has been taking frequent over-the-counter medications, extraneous Tylenol 6 to 8 tablets daily, sometimes Excedrin Migraine 6 tablets,or  ibuprofen,  He denies lateralized motor or sensory deficit, denies double vision   UPDATE Dec 5th 2022: I reviewed ophthalmology evaluation on May 15, 2019 improved, by Dr. Herbert Deaner on May 12, 2021, which showed bilateral optic disc edema  I have put the emergency fluoroscopy guided lumbar puncture also ordered MRI of the brain with without contrast  MRI of the brain with and without contrast on May 29, 2021  reported by Dr. Logan Bores showed 7 cm central skull base tumor, consideration included but not limited to meningioma, lymphoma, plasmacytoma, nasopharyngeal/sinonasal carcinoma,  Patient had fluoroscopy guided lumbar puncture on June 02, 2021, showed significantly elevated opening pressure 48 cmH2O, spinal fluid testing showed no significant abnormality  Patient is a poor historian, today he relies on his mother to talk, mother reported that for 3 weeks she was not able to talk with patient face-to-face, in late November, when she saw patient finally, she noticed patient has crossed eyes, on further questioning, patient reported that he has developed double vision at the end of October, seems to gradually getting worse, he was started on Depakote ER 500 mg since last visit on April 22, 2021, which has helped his headache  Patient was referred by ophthalmologist Dr. Herbert Deaner to neurologist Dr. Forest Gleason who saw patient on June 03, 2021, documented double vision, right 6th nerve palsy, who suggested patient continue follow-up with our clinic  Patient's mother reported that he seems to have worsening abnormal eye movement, patient could not provide clear history, he denies significant headache now, able to continue to work his part-time job at [employer],  UPDATE Jul 08 2021:  His headache overall has improved, reported progressive worsening stuffy nose, mouth breathing, was seen by [facility] neurosurgeon Dr. Tommi Rumps June 24, 2021, further work-up to finalize the diagnosis is pending  Bone scan in Dec 2022 1.  Focal tracer uptake corresponding to clival lesion on prior MRI. 2.  Otherwise no scintigraphic evidence of metastatic disease.   No evidence of primary neoplasm or metastatic disease in the chest,abdomen, or pelvis.  1.  Focal tracer uptake corresponding to clival lesion on prior MRI. 2.  Otherwise no scintigraphic evidence of metastatic disease.   CT chest, abdomen, pelvic,  showed no evidence of metastatic disease or primary neoplasma  Lab in Dec 2022: Hg 12.7, Cal 9.4, creat 1.0, Ig free light chain Kappa 2.92 (elevated), normal lamba,   Normal IFE,  beta-2 microglobulin 1.63,   [facility] neurosurgeon evaluation by Dr. Yetta Glassman on June 24, 2021, 6th nerve palsy to right, full visual field to confrontation, MRI scan extensive ablation of his clivus, not Multicyst,  pituitary gland looks normal,  Going to get blood and urine test for plasmacytoma, complete blood count with differentiation to be certain he does not have lymphoma or leukemia,  Bone scan to see if there are any other lesion,  CT scan of the chest and abdomen   The lesion looks very extensive, like to have more information before any intervention   PHYSICAL EXAM:   Vitals:   07/08/21 1543  BP: (!) 129/93  Pulse: (!) 102  Weight: 200 lb 8 oz (90.9 kg)  Height: 6' (1.829 m)   Not recorded     Body mass index is 27.19 kg/m.  PHYSICAL EXAMNIATION:  Gen: NAD, conversant, well nourised, well groomed           NEUROLOGICAL EXAM:  MENTAL STATUS: Speech/cognition:  Slow reaction, limited word of choice, cooperative, nasal voice, CRANIAL NERVES: CN II: Visual fields are full to confrontation. Pupils are round equal and briskly reactive to light. CN III, IV, VI: Bilateral abductor weakness, CN V: Facial sensation is intact to light touch CN VII: Face is symmetric with normal eye  closure  CN VIII: Hearing is normal to causal conversation. CN IX, X: Phonation is normal. CN XI: Head turning and shoulder shrug are intact  MOTOR: There is no pronator drift of out-stretched arms. Muscle bulk and tone are normal. Muscle strength is normal.  REFLEXES: Reflexes are 2+ and symmetric at the biceps, triceps, knees, and ankles. Plantar responses are flexor.  SENSORY: Intact to light touch, pinprick and vibratory sensation are intact in fingers and toes.  COORDINATION: There is no  trunk or limb dysmetria noted.  GAIT/STANCE: Able to get up from seated position, cautious gait gait, skull-based tends to close 1 eye while ambulating,  REVIEW OF SYSTEMS:  Full 14 system review of systems performed and notable only for as above All other review of systems were negative.   ALLERGIES: No Known Allergies  HOME MEDICATIONS: Current Outpatient Medications  Medication Sig Dispense Refill   atorvastatin (LIPITOR) 10 MG tablet Take 10 mg by mouth daily.     benztropine (COGENTIN) 1 MG tablet Take 1 mg by mouth 2 (two) times daily.       cetirizine (ZYRTEC ALLERGY) 10 MG tablet Take 1 tablet (10 mg total) by mouth daily. 30 tablet 1   diphenhydrAMINE (BENADRYL) 25 MG tablet Take 1 tablet (25 mg total) by mouth every 6 (six) hours. 30 tablet 0   divalproex (DEPAKOTE ER) 500 MG 24 hr tablet Take 1 tablet (500 mg total) by mouth at bedtime. 30 tablet 11   fluticasone (FLONASE) 50 MCG/ACT nasal spray Place 1 spray into both nostrils daily. 16 g 1   loratadine (CLARITIN) 10 MG tablet Take 10 mg by mouth daily.     losartan-hydrochlorothiazide (HYZAAR) 50-12.5 MG tablet Take by mouth.     montelukast (SINGULAIR) 10 MG tablet Take 10 mg by mouth daily.     naproxen sodium (ALEVE) 220 MG tablet Take 1 tablet (220 mg total) by mouth 2 (two) times daily as needed. 60 tablet 0   omeprazole (PRILOSEC) 20 MG capsule Take 1 capsule (20 mg total) by mouth daily. 30 capsule 0   ondansetron (ZOFRAN ODT) 4 MG disintegrating tablet Take 1 tablet (4 mg total) by mouth every 8 (eight) hours as needed. 20 tablet 6   risperiDONE (RISPERDAL) 0.5 MG tablet Take 0.5 mg by mouth at bedtime.     risperiDONE microspheres (RISPERDAL CONSTA) 50 MG injection Inject into the muscle.     rizatriptan (MAXALT-MLT) 10 MG disintegrating tablet Take 1 tablet (10 mg total) by mouth as needed for migraine. May repeat in 2 hours if needed 12 tablet 0   No current facility-administered medications for this visit.     PAST MEDICAL HISTORY: Past Medical History:  Diagnosis Date   Headache    Hearing loss    Hyperlipidemia    Hypertension    Schizophrenia, paranoid type ([place])     PAST SURGICAL HISTORY: History reviewed. No pertinent surgical history.  FAMILY HISTORY: Family History  Problem Relation Age of Onset   Healthy Mother    Diabetes Father    Stroke Maternal Uncle     SOCIAL HISTORY: Social History   Socioeconomic History   Marital status: Legally Separated    Spouse name: Not on file   Number of children: Not on file   Years of education: Not on file   Highest education level: Not on file  Occupational History   Not on file  Tobacco Use   Smoking status: Every Day    Packs/day: 1.00  Types: Cigarettes   Smokeless tobacco: Never  Substance and Sexual Activity   Alcohol use: Yes    Comment: light   Drug use: No   Sexual activity: Not on file  Other Topics Concern   Not on file  Social History Narrative   Lives alone   Right handed   Social Determinants of Health   Financial Resource Strain: Not on file  Food Insecurity: Not on file  Transportation Needs: Not on file  Physical Activity: Not on file  Stress: Not on file  Social Connections: Not on file  Intimate Partner Violence: Not on file    Total time spent reviewing the chart, obtaining history, examined patient, ordering tests, documentation, consultations and family, care coordination was 57 minutes  Marcial Pacas, M.D. Ph.D.  [facility] [address], [place], [place] [address] Ph: [phone] Fax: [phone]  CC:  Lucianne Lei, MD [address] [place],  [place] [address]  Lucianne Lei, MD

## 2021-08-19 ENCOUNTER — Ambulatory Visit: Payer: Medicare Other | Admitting: Neurology

## 2021-08-26 ENCOUNTER — Ambulatory Visit: Payer: Medicare Other | Admitting: Neurology

## 2021-10-13 ENCOUNTER — Ambulatory Visit: Payer: Medicare Other | Admitting: Neurology

## 2021-10-13 ENCOUNTER — Encounter: Payer: Self-pay | Admitting: Neurology

## 2022-02-17 ENCOUNTER — Emergency Department (HOSPITAL_COMMUNITY): Payer: Medicare Other

## 2022-02-17 ENCOUNTER — Emergency Department (HOSPITAL_COMMUNITY)
Admission: EM | Admit: 2022-02-17 | Discharge: 2022-02-17 | Disposition: A | Discharge status: Home / Self Care | Payer: Medicare Other | Attending: Emergency Medicine | Admitting: Emergency Medicine

## 2022-02-17 DIAGNOSIS — R55 Syncope and collapse: Secondary | ICD-10-CM | POA: Diagnosis present

## 2022-02-17 DIAGNOSIS — R42 Dizziness and giddiness: Secondary | ICD-10-CM

## 2022-02-17 DIAGNOSIS — R0602 Shortness of breath: Secondary | ICD-10-CM | POA: Diagnosis not present

## 2022-02-17 DIAGNOSIS — C119 Malignant neoplasm of nasopharynx, unspecified: Secondary | ICD-10-CM | POA: Diagnosis not present

## 2022-02-17 DIAGNOSIS — D72819 Decreased white blood cell count, unspecified: Secondary | ICD-10-CM | POA: Diagnosis not present

## 2022-02-17 LAB — CBC
HCT: 25.5 % — ABNORMAL LOW (ref 39.0–52.0)
Hemoglobin: 9 g/dL — ABNORMAL LOW (ref 13.0–17.0)
MCH: 34.9 pg — ABNORMAL HIGH (ref 26.0–34.0)
MCHC: 35.3 g/dL (ref 30.0–36.0)
MCV: 98.8 fL (ref 80.0–100.0)
Platelets: 134 10*3/uL — ABNORMAL LOW (ref 150–400)
RBC: 2.58 MIL/uL — ABNORMAL LOW (ref 4.22–5.81)
RDW: 15.9 % — ABNORMAL HIGH (ref 11.5–15.5)
WBC: 0.9 10*3/uL — CL (ref 4.0–10.5)
nRBC: 0 % (ref 0.0–0.2)

## 2022-02-17 LAB — BASIC METABOLIC PANEL
Anion gap: 14 (ref 5–15)
BUN: 27 mg/dL — ABNORMAL HIGH (ref 6–20)
CO2: 26 mmol/L (ref 22–32)
Calcium: 9.6 mg/dL (ref 8.9–10.3)
Chloride: 93 mmol/L — ABNORMAL LOW (ref 98–111)
Creatinine, Ser: 1.88 mg/dL — ABNORMAL HIGH (ref 0.61–1.24)
GFR, Estimated: 43 mL/min — ABNORMAL LOW (ref >60)
Glucose, Bld: 95 mg/dL (ref 70–99)
Potassium: 2.8 mmol/L — ABNORMAL LOW (ref 3.5–5.1)
Sodium: 133 mmol/L — ABNORMAL LOW (ref 135–145)

## 2022-02-17 LAB — URINALYSIS, ROUTINE W REFLEX MICROSCOPIC
Bilirubin Urine: NEGATIVE
Glucose, UA: NEGATIVE mg/dL
Hgb urine dipstick: NEGATIVE
Ketones, ur: NEGATIVE mg/dL
Leukocytes,Ua: NEGATIVE
Nitrite: NEGATIVE
Protein, ur: NEGATIVE mg/dL
Specific Gravity, Urine: 1.013 (ref 1.005–1.030)
pH: 7 (ref 5.0–8.0)

## 2022-02-17 LAB — TROPONIN I (HIGH SENSITIVITY): Troponin I (High Sensitivity): 5 ng/L (ref <18)

## 2022-02-17 MED ORDER — SODIUM CHLORIDE 0.9 % IV BOLUS
1000.0000 mL | Freq: Once | INTRAVENOUS | Status: AC
  Administered 2022-02-17: 1000 mL via INTRAVENOUS

## 2022-02-17 MED ORDER — POTASSIUM CHLORIDE 10 MEQ/100ML IV SOLN
10.0000 meq | Freq: Once | INTRAVENOUS | Status: AC
  Administered 2022-02-17: 10 meq via INTRAVENOUS
  Filled 2022-02-17: qty 100

## 2022-02-17 MED ORDER — IOHEXOL 350 MG/ML SOLN
65.0000 mL | Freq: Once | INTRAVENOUS | Status: AC | PRN
  Administered 2022-02-17: 65 mL via INTRAVENOUS

## 2022-02-17 MED ORDER — POTASSIUM CHLORIDE CRYS ER 20 MEQ PO TBCR
40.0000 meq | EXTENDED_RELEASE_TABLET | Freq: Once | ORAL | Status: AC
  Administered 2022-02-17: 40 meq via ORAL
  Filled 2022-02-17: qty 2

## 2022-02-17 NOTE — ED Triage Notes (Signed)
Pt c/o multiple syncopal episodes x2 days, "falling out." Decreased PO intake, took BP meds per normal, slightly hypotensive in triage.

## 2022-02-17 NOTE — ED Provider Notes (Signed)
[facility] EMERGENCY DEPARTMENT Provider Note   CSN: [id number] Arrival date & time: 02/17/22  1559     History  Chief Complaint  Patient presents with   Loss of Consciousness   Weakness   Hypotension    ROMMEL Hernandez is a 51 y.o. male.  Patient here after syncopal/lightheaded event.  He states that today he has had some lightheadedness episodes.  He had several episodes of emesis yesterday and some loose stool but none currently.  He states that he felt lightheaded today and fell to the ground but did not lose consciousness.  He has felt very weak today.  But he is feeling more improved upon my evaluation.  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  He states that he is felt short of breath.  He has not been eating or drinking well.  Denies any chest pain, abdominal pain, numbness, weakness, chills.  The history is provided by the patient.       Home Medications Prior to Admission medications   Medication Sig Start Date End Date Taking? Authorizing Provider  atorvastatin (LIPITOR) 10 MG tablet Take 10 mg by mouth daily. 05/30/20   [provider]  benztropine (COGENTIN) 1 MG tablet Take 1 mg by mouth 2 (two) times daily.      [provider]  cetirizine (ZYRTEC ALLERGY) 10 MG tablet Take 1 tablet (10 mg total) by mouth daily. 06/20/20   Volney American, PA-C  diphenhydrAMINE (BENADRYL) 25 MG tablet Take 1 tablet (25 mg total) by mouth every 6 (six) hours. 04/25/21   Drenda Freeze, MD  divalproex (DEPAKOTE ER) 500 MG 24 hr tablet Take 1 tablet (500 mg total) by mouth at bedtime. 04/22/21   Marcial Pacas, MD  fluticasone (FLONASE) 50 MCG/ACT nasal spray Place 1 spray into both nostrils daily. 06/20/20   Volney American, PA-C  loratadine (CLARITIN) 10 MG tablet Take 10 mg by mouth daily. 11/14/20   [provider]  losartan-hydrochlorothiazide (HYZAAR) 50-12.5 MG tablet Take by mouth.  11/29/19   [provider]  montelukast (SINGULAIR) 10 MG tablet Take 10 mg by mouth daily. 05/30/20   [provider]  naproxen sodium (ALEVE) 220 MG tablet Take 1 tablet (220 mg total) by mouth 2 (two) times daily as needed. 04/25/21   Drenda Freeze, MD  omeprazole (PRILOSEC) 20 MG capsule Take 1 capsule (20 mg total) by mouth daily. 08/03/15   Jola Schmidt, MD  ondansetron (ZOFRAN ODT) 4 MG disintegrating tablet Take 1 tablet (4 mg total) by mouth every 8 (eight) hours as needed. 04/22/21   Marcial Pacas, MD  risperiDONE (RISPERDAL) 0.5 MG tablet Take 0.5 mg by mouth at bedtime. 12/25/20   [provider]  risperiDONE microspheres (RISPERDAL CONSTA) 50 MG injection Inject into the muscle. 11/16/19   [provider]  rizatriptan (MAXALT-MLT) 10 MG disintegrating tablet Take 1 tablet (10 mg total) by mouth as needed for migraine. May repeat in 2 hours if needed 04/25/21   Drenda Freeze, MD      Allergies    Patient has no known allergies.    Review of Systems   Review of Systems  Physical Exam Updated Vital Signs  ED Triage Vitals  Enc Vitals Group     BP 02/17/22 1646 100/76     Pulse Rate 02/17/22 1646 92     Resp 02/17/22 1646 18     Temp 02/17/22 1646 99.1 F (  37.3 C)     Temp Source 02/17/22 1646 Oral     SpO2 02/17/22 1646 100 %     Weight --      Height --      Head Circumference --      Peak Flow --      Pain Score 02/17/22 1656 0     Pain Loc --      Pain Edu? --      Excl. in [place]? --     Physical Exam Vitals and nursing note reviewed.  Constitutional:      General: He is not in acute distress.    Appearance: He is well-developed. He is not ill-appearing.  HENT:     Head: Normocephalic and atraumatic.     Right Ear: Tympanic membrane normal.     Nose: Nose normal.     Mouth/Throat:     Mouth: Mucous membranes are dry.  Eyes:     Extraocular Movements: Extraocular movements intact.     Conjunctiva/sclera: Conjunctivae  normal.     Pupils: Pupils are equal, round, and reactive to light.  Cardiovascular:     Rate and Rhythm: Normal rate and regular rhythm.     Pulses: Normal pulses.     Heart sounds: Normal heart sounds. No murmur heard. Pulmonary:     Effort: Pulmonary effort is normal. No respiratory distress.     Breath sounds: Normal breath sounds.  Abdominal:     Palpations: Abdomen is soft.     Tenderness: There is no abdominal tenderness.  Musculoskeletal:        General: No swelling.     Cervical back: Normal range of motion and neck supple.  Skin:    General: Skin is warm and dry.     Capillary Refill: Capillary refill takes less than 2 seconds.  Neurological:     General: No focal deficit present.     Mental Status: He is alert and oriented to person, place, and time.     Cranial Nerves: No cranial nerve deficit.     Sensory: No sensory deficit.     Motor: No weakness.     Coordination: Coordination normal.     Comments: 5+ out of 5 strength throughout, normal sensation, no drift, normal speech  Psychiatric:        Mood and Affect: Mood normal.     ED Results / Procedures / Treatments   Labs (all labs ordered are listed, but only abnormal results are displayed) Labs Reviewed  BASIC METABOLIC PANEL - Abnormal; Notable for the following components:      Result Value   Sodium 133 (*)    Potassium 2.8 (*)    Chloride 93 (*)    BUN 27 (*)    Creatinine, Ser 1.88 (*)    GFR, Estimated 43 (*)    All other components within normal limits  CBC - Abnormal; Notable for the following components:   WBC 0.9 (*)    RBC 2.58 (*)    Hemoglobin 9.0 (*)    HCT 25.5 (*)    MCH 34.9 (*)    RDW 15.9 (*)    Platelets 134 (*)    All other components within normal limits  URINALYSIS, ROUTINE W REFLEX MICROSCOPIC  DIFFERENTIAL  TROPONIN I (HIGH SENSITIVITY)    EKG EKG Interpretation  Date/Time:  Tuesday February 17 2022 16:48:57 EDT Ventricular Rate:  93 PR Interval:  122 QRS  Duration: 90 QT Interval:  386 QTC Calculation:  479 R Axis:   38 Text Interpretation: Normal sinus rhythm Septal infarct , age undetermined Abnormal ECG When compared with ECG of 02-Aug-2015 20:50, PREVIOUS ECG IS PRESENT Confirmed by Lennice Sites [phone]) on 02/17/2022 8:13:25 PM  Radiology CT Angio Chest PE W and/or Wo Contrast  Result Date: 02/17/2022 CLINICAL DATA:  Pulmonary embolism (PE) suspected, high prob. Near syncope EXAM: CT ANGIOGRAPHY CHEST WITH CONTRAST TECHNIQUE: Multidetector CT imaging of the chest was performed using the standard protocol during bolus administration of intravenous contrast. Multiplanar CT image reconstructions and MIPs were obtained to evaluate the vascular anatomy. RADIATION DOSE REDUCTION: This exam was performed according to the departmental dose-optimization program which includes automated exposure control, adjustment of the mA and/or kV according to patient size and/or use of iterative reconstruction technique. CONTRAST:  12m OMNIPAQUE IOHEXOL 350 MG/ML SOLN COMPARISON:  None Available. FINDINGS: Cardiovascular: No filling defects in the pulmonary arteries to suggest pulmonary emboli. Heart is normal size. Aorta is normal caliber. Mediastinum/Nodes: No mediastinal, hilar, or axillary adenopathy. Trachea and esophagus are unremarkable. Thyroid unremarkable. Lungs/Pleura: Lungs are clear. No focal airspace opacities or suspicious nodules. No effusions. Upper Abdomen: No acute findings Musculoskeletal: Chest wall soft tissues are unremarkable. No acute bony abnormality. Review of the MIP images confirms the above findings. IMPRESSION: No evidence of pulmonary embolus. No acute cardiopulmonary disease. Electronically Signed   By: KRolm BaptiseM.D.   On: 02/17/2022 23:04   DG Chest Portable 1 View  Result Date: 02/17/2022 CLINICAL DATA:  Shortness of breath with multiple recent syncopal episodes. EXAM: PORTABLE CHEST 1 VIEW COMPARISON:  Purulent 08/02/2015 FINDINGS: The  heart size and mediastinal contours are within normal limits. Both lungs are clear. The visualized skeletal structures are unremarkable apart from slight thoracic dextroscoliosis. There are multiple overlying monitor wires. IMPRESSION: No active disease.  Stable chest. Electronically Signed   By: KTelford NabM.D.   On: 02/17/2022 21:04   CT Head Wo Contrast  Result Date: 02/17/2022 CLINICAL DATA:  Syncope/presyncope.  History of intracranial tumor. EXAM: CT HEAD WITHOUT CONTRAST TECHNIQUE: Contiguous axial images were obtained from the base of the skull through the vertex without intravenous contrast. RADIATION DOSE REDUCTION: This exam was performed according to the departmental dose-optimization program which includes automated exposure control, adjustment of the mA and/or kV according to patient size and/or use of iterative reconstruction technique. COMPARISON:  CT head 06/18/2021 FINDINGS: Brain: Ventricle size is normal.  No acute infarct or hemorrhage. Interval improvement in bilateral cavernous sinus mass lesion. No new mass lesion. Vascular: Negative for hyperdense vessel Skull: Destructive mass lesion involving the clivus similar to the prior study. Sinuses/Orbits: Posterior nasopharyngeal mass lesion has improved. This is more prominent the left than the right. There is tumor or mucosal edema in the sphenoid sinus and posterior ethmoid sinuses. Mild mucosal edema left maxillary sinus. Mild mucosal edema in the mastoid bilaterally. Other: None IMPRESSION: Interval improvement in posterior nasopharyngeal mass compatible with tumor. Improved tumor in the cavernous sinus bilaterally. Bony destruction of the clivus similar to the prior study due to tumor extension. No acute intracranial abnormality. Electronically Signed   By: CFranchot GalloM.D.   On: 02/17/2022 18:27    Procedures Procedures    Medications Ordered in ED Medications  potassium chloride SA (KLOR-CON M) CR tablet 40 mEq (has no  administration in time range)  sodium chloride 0.9 % bolus 1,000 mL (1,000 mLs Intravenous New Bag/Given 02/17/22 2140)  potassium chloride 10 mEq in 100 mL IVPB (10  mEq Intravenous New Bag/Given 02/17/22 2201)  iohexol (OMNIPAQUE) 350 MG/ML injection 65 mL (65 mLs Intravenous Contrast Given 02/17/22 2227)    ED Course/ Medical Decision Making/ A&P                           Medical Decision Making Amount and/or Complexity of Data Reviewed Labs: ordered. Radiology: ordered.  Risk Prescription drug management.   Erick Alley [place] is here after near syncopal episode.  Patient with some shortness of breath, lightheadedness especially when changing position.  Orthostatics were checked in the room 528 systolic with lying and sitting and blood pressure 115 when standing.  Little bit of lightheadedness when he stood but was able to stand under his own power.  Neurologically he is intact.  He is currently being treated with chemotherapy for nasopharyngeal carcinoma at [facility].  Last infusion was last week.  He had some GI symptoms yesterday but resolved today.  He has not been eating and drinking well.  He denies any headache, chest pain.  He had some shortness of breath.  Denies any fever or chills.  His vital signs are overall unremarkable.  No fever.  Differential diagnosis is may be dehydration versus orthostatic process versus less likely PE/ACS.  We will get CBC, BMP, head CT, chest x-ray, CT PE study, troponin, EKG.  Will give IV fluid bolus.  Per my review and interpretation of labs, white count 0.9, hemoglobin is 9, creatinine is 1.8, potassium is 2.8.  Per review of his deep medical chart, these are baseline labs.  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  His white count, hemoglobin, creatinine appears at baseline.  His troponin is normal.  EKG per my review interpretation shows sinus rhythm.  No ischemic changes.  He is feeling much better after IV fluids and potassium.  Chest  x-ray per my review and interpretation shows no pneumonia or pneumothorax.  Urinalysis negative for infection.  Overall awaiting CT scan to evaluate for PE and anticipate discharge to home.  CT scan showed no evidence of pulmonary embolism.  No pneumonia.  Patient discharged in good condition.  Understands return precautions.  Recommend follow-up W pcp.  This chart was dictated using voice recognition software.  Despite best efforts to proofread,  errors can occur which can change the documentation meaning.         Final Clinical Impression(s) / ED Diagnoses Final diagnoses:  Postural dizziness with near syncope    Rx / DC Orders ED Discharge Orders     None         Lennice Sites, DO 02/17/22 2309

## 2022-02-17 NOTE — Discharge Instructions (Signed)
Stay well-hydrated.  Follow-up with your primary care doctor.  Your work-up today showed no evidence of infection or blood clot or heart attack.  Overall suspect you might have some mild dehydration which should be improved now that you have been given some IV fluids.  Please take your time when you are changing positions from sitting to standing.

## 2022-02-17 NOTE — ED Notes (Signed)
Patient transported to CT 

## 2022-02-17 NOTE — ED Provider Triage Note (Signed)
Emergency Medicine Provider Triage Evaluation Note  [name] , a 51 y.o. male  was evaluated in triage.  Pt complains of multiple near syncope events that occurred throughout the day.  Patient states that he would be walking and he became so lightheaded that he fell to the ground.  He states that he did not lose consciousness, but he just overall felt very weak and that he cannot carry his own body weight.  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  He has known eye deviation on the right side secondary to this.  He states that at the time, he did have some numbness and tingling all over but this is since resolved.  Denies chest pain, shortness of breath, abdominal pain, nausea, vomiting and diarrhea. Review of Systems  Positive:  Negative:   Physical Exam  BP 100/76 (BP Location: Right Arm)   Pulse 92   Temp 99.1 F (37.3 C) (Oral)   Resp 18   SpO2 100%  Gen:   Awake, no distress   Resp:  Normal effort  MSK:   Moves extremities without difficulty  Other:  Speech is clear, able to follow commands strong and equal grip strength Subjective sensation intact Moves extremities without ataxia, coordination intact Normal finger to nose and rapid alternating movements No pronator drift   Medical Decision Making  Medically screening exam initiated at 5:05 PM.  Appropriate orders placed.  Erick Alley Blackshire was informed that the remainder of the evaluation will be completed by another provider, this initial triage assessment does not replace that evaluation, and the importance of remaining in the ED until their evaluation is complete.     Tonye Pearson, [place] 02/17/22 [id number]

## 2022-02-18 LAB — DIFFERENTIAL
Abs Immature Granulocytes: 0 10*3/uL (ref 0.00–0.07)
Basophils Absolute: 0 10*3/uL (ref 0.0–0.1)
Basophils Relative: 0 %
Eosinophils Absolute: 0.1 10*3/uL (ref 0.0–0.5)
Eosinophils Relative: 10 %
Immature Granulocytes: 0 %
Lymphocytes Relative: 23 %
Lymphs Abs: 0.2 10*3/uL — ABNORMAL LOW (ref 0.7–4.0)
Monocytes Absolute: 0 10*3/uL — ABNORMAL LOW (ref 0.1–1.0)
Monocytes Relative: 1 %
Neutro Abs: 0.6 10*3/uL — ABNORMAL LOW (ref 1.7–7.7)
Neutrophils Relative %: 66 %

## 2022-04-01 ENCOUNTER — Emergency Department (HOSPITAL_COMMUNITY)
Admission: EM | Admit: 2022-04-01 | Discharge: 2022-04-01 | Disposition: A | Discharge status: Home / Self Care | Payer: Medicare Other | Attending: Emergency Medicine | Admitting: Emergency Medicine

## 2022-04-01 ENCOUNTER — Emergency Department (HOSPITAL_COMMUNITY): Payer: Medicare Other

## 2022-04-01 ENCOUNTER — Encounter (HOSPITAL_COMMUNITY): Payer: Self-pay | Admitting: Emergency Medicine

## 2022-04-01 DIAGNOSIS — W19XXXA Unspecified fall, initial encounter: Secondary | ICD-10-CM | POA: Insufficient documentation

## 2022-04-01 DIAGNOSIS — M545 Low back pain, unspecified: Secondary | ICD-10-CM | POA: Diagnosis not present

## 2022-04-01 DIAGNOSIS — R531 Weakness: Secondary | ICD-10-CM | POA: Diagnosis present

## 2022-04-01 LAB — COMPREHENSIVE METABOLIC PANEL
ALT: 20 U/L (ref 0–44)
AST: 19 U/L (ref 15–41)
Albumin: 3.3 g/dL — ABNORMAL LOW (ref 3.5–5.0)
Alkaline Phosphatase: 70 U/L (ref 38–126)
Anion gap: 10 (ref 5–15)
BUN: 22 mg/dL — ABNORMAL HIGH (ref 6–20)
CO2: 25 mmol/L (ref 22–32)
Calcium: 9.5 mg/dL (ref 8.9–10.3)
Chloride: 99 mmol/L (ref 98–111)
Creatinine, Ser: 1.27 mg/dL — ABNORMAL HIGH (ref 0.61–1.24)
GFR, Estimated: >60 mL/min (ref >60)
Glucose, Bld: 90 mg/dL (ref 70–99)
Potassium: 3.8 mmol/L (ref 3.5–5.1)
Sodium: 134 mmol/L — ABNORMAL LOW (ref 135–145)
Total Bilirubin: 0.7 mg/dL (ref 0.3–1.2)
Total Protein: 6.4 g/dL — ABNORMAL LOW (ref 6.5–8.1)

## 2022-04-01 LAB — CBC WITH DIFFERENTIAL/PLATELET
Abs Immature Granulocytes: 0 10*3/uL (ref 0.00–0.07)
Basophils Absolute: 0 10*3/uL (ref 0.0–0.1)
Basophils Relative: 0 %
Eosinophils Absolute: 0.2 10*3/uL (ref 0.0–0.5)
Eosinophils Relative: 2 %
HCT: 24.2 % — ABNORMAL LOW (ref 39.0–52.0)
Hemoglobin: 8.2 g/dL — ABNORMAL LOW (ref 13.0–17.0)
Lymphocytes Relative: 9 %
Lymphs Abs: 0.9 10*3/uL (ref 0.7–4.0)
MCH: 34.7 pg — ABNORMAL HIGH (ref 26.0–34.0)
MCHC: 33.9 g/dL (ref 30.0–36.0)
MCV: 102.5 fL — ABNORMAL HIGH (ref 80.0–100.0)
Monocytes Absolute: 1.2 10*3/uL — ABNORMAL HIGH (ref 0.1–1.0)
Monocytes Relative: 12 %
Neutro Abs: 7.7 10*3/uL (ref 1.7–7.7)
Neutrophils Relative %: 77 %
Platelets: 125 10*3/uL — ABNORMAL LOW (ref 150–400)
RBC: 2.36 MIL/uL — ABNORMAL LOW (ref 4.22–5.81)
RDW: 20.7 % — ABNORMAL HIGH (ref 11.5–15.5)
WBC: 10 10*3/uL (ref 4.0–10.5)
nRBC: 0 % (ref 0.0–0.2)
nRBC: 1 /100 WBC — ABNORMAL HIGH

## 2022-04-01 LAB — URINALYSIS, ROUTINE W REFLEX MICROSCOPIC
Bilirubin Urine: NEGATIVE
Glucose, UA: NEGATIVE mg/dL
Hgb urine dipstick: NEGATIVE
Ketones, ur: NEGATIVE mg/dL
Leukocytes,Ua: NEGATIVE
Nitrite: NEGATIVE
Protein, ur: NEGATIVE mg/dL
Specific Gravity, Urine: 1.004 — ABNORMAL LOW (ref 1.005–1.030)
pH: 8 (ref 5.0–8.0)

## 2022-04-01 MED ORDER — ACETAMINOPHEN 325 MG PO TABS
650.0000 mg | ORAL_TABLET | Freq: Once | ORAL | Status: AC
  Administered 2022-04-01: 650 mg via ORAL
  Filled 2022-04-01: qty 2

## 2022-04-01 MED ORDER — SODIUM CHLORIDE 0.9 % IV BOLUS
500.0000 mL | Freq: Once | INTRAVENOUS | Status: AC
  Administered 2022-04-01: 500 mL via INTRAVENOUS

## 2022-04-01 NOTE — ED Provider Notes (Signed)
[place] EMERGENCY DEPARTMENT Provider Note   CSN: [id number] Arrival date & time: 04/01/22  1815     History  Chief Complaint  Patient presents with   Weakness    [name] is a 51 y.o. male.  51 year old male with prior medical history as detailed below presents for evaluation.  Patient reports that he called EMS today after experiencing weakness through the course of the day.  He reports that his "legs gave out on him" 3 times causing him to fall.  He denies any specific pain related to his fall other than mildly increased low back pain.  Patient reports that he is currently in treatment with [facility] for nasopharyngeal cancer.  He received chemo last week.  He denies recent fever.  He denies nausea or vomiting.  He reports good p.o. intake.  He denies headache or visual change.  He denies focal weakness.  The history is provided by the patient and medical records.       Home Medications Prior to Admission medications   Medication Sig Start Date End Date Taking? Authorizing Provider  atorvastatin (LIPITOR) 10 MG tablet Take 10 mg by mouth daily. 05/30/20   [provider]  benztropine (COGENTIN) 1 MG tablet Take 1 mg by mouth 2 (two) times daily.      [provider]  cetirizine (ZYRTEC ALLERGY) 10 MG tablet Take 1 tablet (10 mg total) by mouth daily. 06/20/20   Volney American, PA-C  diphenhydrAMINE (BENADRYL) 25 MG tablet Take 1 tablet (25 mg total) by mouth every 6 (six) hours. 04/25/21   Drenda Freeze, MD  divalproex (DEPAKOTE ER) 500 MG 24 hr tablet Take 1 tablet (500 mg total) by mouth at bedtime. 04/22/21   Marcial Pacas, MD  fluticasone (FLONASE) 50 MCG/ACT nasal spray Place 1 spray into both nostrils daily. 06/20/20   Volney American, PA-C  loratadine (CLARITIN) 10 MG tablet Take 10 mg by mouth daily. 11/14/20   [provider]  losartan-hydrochlorothiazide (HYZAAR) 50-12.5 MG tablet Take by mouth. 11/29/19    [provider]  montelukast (SINGULAIR) 10 MG tablet Take 10 mg by mouth daily. 05/30/20   [provider]  naproxen sodium (ALEVE) 220 MG tablet Take 1 tablet (220 mg total) by mouth 2 (two) times daily as needed. 04/25/21   Drenda Freeze, MD  omeprazole (PRILOSEC) 20 MG capsule Take 1 capsule (20 mg total) by mouth daily. 08/03/15   Jola Schmidt, MD  ondansetron (ZOFRAN ODT) 4 MG disintegrating tablet Take 1 tablet (4 mg total) by mouth every 8 (eight) hours as needed. 04/22/21   Marcial Pacas, MD  risperiDONE (RISPERDAL) 0.5 MG tablet Take 0.5 mg by mouth at bedtime. 12/25/20   [provider]  risperiDONE microspheres (RISPERDAL CONSTA) 50 MG injection Inject into the muscle. 11/16/19   [provider]  rizatriptan (MAXALT-MLT) 10 MG disintegrating tablet Take 1 tablet (10 mg total) by mouth as needed for migraine. May repeat in 2 hours if needed 04/25/21   Drenda Freeze, MD      Allergies    Patient has no known allergies.    Review of Systems   Review of Systems  Constitutional:  Positive for fatigue.  Neurological:  Positive for weakness.  All other systems reviewed and are negative.   Physical Exam Updated Vital Signs BP 124/80 (BP Location: Right Arm)   Pulse 89   Temp 98 F (36.7 C) (Oral)   Resp 18  Ht 6' (1.829 m)   Wt 90.7 kg   SpO2 100%   BMI 27.12 kg/m  Physical Exam Vitals and nursing note reviewed.  Constitutional:      General: He is not in acute distress.    Appearance: Normal appearance. He is well-developed.  HENT:     Head: Normocephalic and atraumatic.  Eyes:     Conjunctiva/sclera: Conjunctivae normal.     Pupils: Pupils are equal, round, and reactive to light.  Cardiovascular:     Rate and Rhythm: Normal rate and regular rhythm.     Heart sounds: Normal heart sounds.  Pulmonary:     Effort: Pulmonary effort is normal. No respiratory distress.     Breath sounds: Normal breath sounds.  Abdominal:      General: There is no distension.     Palpations: Abdomen is soft.     Tenderness: There is no abdominal tenderness.  Musculoskeletal:        General: No deformity. Normal range of motion.     Cervical back: Normal range of motion and neck supple.  Skin:    General: Skin is warm and dry.  Neurological:     General: No focal deficit present.     Mental Status: He is alert and oriented to person, place, and time.     ED Results / Procedures / Treatments   Labs (all labs ordered are listed, but only abnormal results are displayed) Labs Reviewed  CBC WITH DIFFERENTIAL/PLATELET  COMPREHENSIVE METABOLIC PANEL  URINALYSIS, ROUTINE W REFLEX MICROSCOPIC    EKG EKG Interpretation  Date/Time:  Wednesday April 01 2022 19:26:23 EDT Ventricular Rate:  88 PR Interval:  119 QRS Duration: 87 QT Interval:  358 QTC Calculation: 434 R Axis:   58 Text Interpretation: Sinus rhythm Borderline short PR interval Abnormal R-wave progression, early transition Confirmed by Dene Gentry [phone]) on 04/01/2022 7:29:19 PM  Radiology DG Chest 1 View  Result Date: 04/01/2022 CLINICAL DATA:  Pain.  Several falls. EXAM: CHEST  1 VIEW COMPARISON:  Radiograph and CT 02/17/2022 FINDINGS: The cardiomediastinal contours are normal. The lungs are clear. Pulmonary vasculature is normal. No consolidation, pleural effusion, or pneumothorax. No acute osseous abnormalities are seen. IMPRESSION: No acute chest findings. Electronically Signed   By: Keith Rake M.D.   On: 04/01/2022 19:15   DG Lumbar Spine Complete  Result Date: 04/01/2022 CLINICAL DATA:  Post fall with back pain. Several falls from standing. Low back pain after a fall today. EXAM: LUMBAR SPINE - COMPLETE 4+ VIEW COMPARISON:  None available. FINDINGS: Probable small ribs at L1. No acute fracture. Normal alignment. Endplate undulation at L5 likely related to Schmorl's nodes. The posterior elements are intact. Mild anterior spurring at multiple  levels with slight L3-L4 disc space narrowing. Remaining disc spaces are preserved. Normal vertebral body heights. No evidence of focal bone lesion. IMPRESSION: 1. No acute fracture or subluxation of the lumbar spine. 2. Mild multilevel degenerative disc disease. Electronically Signed   By: Keith Rake M.D.   On: 04/01/2022 19:10    Procedures Procedures    Medications Ordered in ED Medications  sodium chloride 0.9 % bolus 500 mL (500 mLs Intravenous New Bag/Given 04/01/22 1925)    ED Course/ Medical Decision Making/ A&P                           Medical Decision Making Amount and/or Complexity of Data Reviewed Labs: ordered. Radiology: ordered.  Risk OTC  drugs.    Medical Screen Complete  This patient presented to the ED with complaint of weakness.  This complaint involves an extensive number of treatment options. The initial differential diagnosis includes, but is not limited to, metabolic abnormality, dehydration, etc.  This presentation is: Acute, Chronic, Self-Limited, Previously Undiagnosed, Uncertain Prognosis, Complicated, Systemic Symptoms, and Threat to Life/Bodily Function  Patient presents with complaint of generalized weakness and fatigue.  Symptoms began today approximately 1 week after chemotherapy treatment with [facility] for treatment of nasopharyngeal carcinoma.  Patient without reported fever.  He denies headache or chest pain.  He denies numbness, tingling, visual change.  Patient did complain of some mild low back pain that he thinks he may have strained during one of his falls today.  Imaging obtained today is without significant abnormality.  Labs are without significant acute change from baseline.  Patient is reporting feeling improved after ED evaluation.  He desires discharge home.  Strict return precautions given and understood.   Additional history obtained: External records from outside sources obtained and reviewed including prior ED visits  and prior Inpatient records.    Lab Tests:  I ordered and personally interpreted labs.  The pertinent results include: CBC, CMP, UA    Imaging Studies ordered:  I ordered imaging studies including  plain films of chest and LS spine I independently visualized and interpreted obtained imaging which showed NAD I agree with the radiologist interpretation.   Cardiac Monitoring:  The patient was maintained on a cardiac monitor.  I personally viewed and interpreted the cardiac monitor which showed an underlying rhythm of: NSR   Medicines ordered:  I ordered medication including IV fluids, Tylenol for suspected dehydration, pain Reevaluation of the patient after these medicines showed that the patient: improved   Problem List / ED Course:  Fatigue, weakness   Reevaluation:  After the interventions noted above, I reevaluated the patient and found that they have: improved  Disposition:  After consideration of the diagnostic results and the patients response to treatment, I feel that the patent would benefit from close outpatient follow-up.          Final Clinical Impression(s) / ED Diagnoses Final diagnoses:  Weakness    Rx / DC Orders ED Discharge Orders     None         Valarie Merino, MD 04/01/22 2217

## 2022-04-01 NOTE — Discharge Instructions (Signed)
Return for any problem.  ?

## 2022-04-01 NOTE — ED Triage Notes (Addendum)
Pt transported from home by [facility] after several falls when standing. Pt reports this occurs semi regularly since starting chemo. Pt c/o low back from a fall today. Pt initially hypotensive, #18L AC, 500cc bolus given by EMS. XOG00. Last chemo tx 1 week ago at [facility]

## 2022-04-01 NOTE — ED Notes (Signed)
Patient verbalizes understanding of discharge instructions. Opportunity for questioning and answers were provided. Armband removed by staff, pt discharged from ED.  

## 2022-04-23 ENCOUNTER — Other Ambulatory Visit: Payer: Self-pay | Admitting: Neurology

## 2022-04-23 NOTE — Telephone Encounter (Signed)
Rx filled to avoid disruption of care. No further refills will be provided without an appointment.

## 2022-05-26 ENCOUNTER — Other Ambulatory Visit: Payer: Self-pay | Admitting: Neurology

## 2022-06-15 ENCOUNTER — Other Ambulatory Visit: Payer: Self-pay | Admitting: Neurology

## 2022-06-16 ENCOUNTER — Other Ambulatory Visit: Payer: Self-pay | Admitting: Neurology

## 2022-07-10 ENCOUNTER — Other Ambulatory Visit: Payer: Self-pay | Admitting: Neurology

## 2022-08-10 ENCOUNTER — Other Ambulatory Visit: Payer: Self-pay | Admitting: Neurology

## 2022-08-10 NOTE — Telephone Encounter (Signed)
Please call pt and offer him appt to get his medication refill

## 2022-08-10 NOTE — Telephone Encounter (Signed)
Called and scheduled pt for a yr f/u.

## 2022-08-25 NOTE — Progress Notes (Unsigned)
No chief complaint on file.    ASSESSMENT AND PLAN  [name] is a 52 y.o. male  Skull base tumor,  7 cm, involving central skull base, regional bone structure destruction including diffuse involvement of clivus, with tumor also extending inferiorly and laterally into the occipital condyles tumor extends superiorly into the sphenoid sinus, feels nasopharyngeal with extension into the posterior aspect of the nasal cavity, a component of right cavernous sinus measuring 2.8 x 2.3 cm, bulging laterally into the right medial cranial fossa, there is involvement of multiple skull base foraminal, as well as asymmetric feeling of the right Meckel's cave, the pituitary gland is separate from the mass,  Evidence of intracranial 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 Tommi Rumps, will continue treatment there  DIAGNOSTIC DATA (LABS, IMAGING, TESTING) - I reviewed patient records, labs, notes, testing and imaging myself where available.   MEDICAL HISTORY:  [name] is a 52 year old male, seen in request by his primary care physician Dr. Criss Rosales, Veita adduction of frequent migraine headache, he is accompanied by his case manager Sunny Schlein, who has known him for 3 years at today's visit on Oct/05/2021  I reviewed and summarized the referring note. PMHx. HTN HLD.  If you miss an appointment you reschedule another appointment for subjective chart  4 years and and no daycare attendance parents.  She had 15 x-rays.  And to get up 15 g right and so But at today daily gait instability: Alert colonoscope canceled appointment which is something that should go because if there is no appointment scheduled date: Take up to 5 L O2 he had a COVID completed 2:00 today consented will need appointment still aimovig will send an appointment of some neck no prescription  Helpful  Reviewed follow-up with headaches earlier today.  Okay Continue adjunctively having any significant regularity neck: Respiratory: Clear:, Ongoing  Most of the time.   Paranoid Schizophrenia,    He has lifelong history of paranoid schizophrenia, under stable control taking Risperdal injection 50 mg, along with risperidone tablet 0.5 mg every night, he lives at home, works part-time job, washing dishes at [employer]  He complains of severe headache since 2021, denies a history of headache before that, his headache described as right retro-orbital area severe pain, pounding, extending to right face, lasting hours, it happened couple times each week  I personally reviewed CT head without contrast March 2022, no acute abnormality  Because of frequent headache, he has been taking frequent over-the-counter medications, extraneous Tylenol 6 to 8 tablets daily, sometimes Excedrin Migraine 6 tablets,or  ibuprofen,  He denies lateralized motor or sensory deficit, denies double vision   UPDATE Dec 5th 2022: I reviewed ophthalmology evaluation on May 15, 2019 improved, by Dr. Herbert Deaner on May 12, 2021, which showed bilateral optic disc edema  I have put the emergency fluoroscopy guided lumbar puncture also ordered MRI of the brain with without contrast  MRI of the brain with and without contrast on May 29, 2021  reported by Dr. Logan Bores showed 7 cm central skull base tumor, consideration included but not limited to meningioma, lymphoma, plasmacytoma,  nasopharyngeal/sinonasal carcinoma,  Patient had fluoroscopy guided lumbar puncture on June 02, 2021, showed significantly elevated opening pressure 48 cmH2O, spinal fluid testing showed no significant abnormality  Patient is a poor historian, today he relies on his mother to talk, mother reported that for 3 weeks she was not able to talk with patient face-to-face, in late November, when she saw patient finally, she noticed patient has crossed eyes, on further questioning, patient reported that he has developed double vision at the end of October, seems to gradually getting worse, he was started on Depakote ER 500 mg since last visit on April 22, 2021, which has helped his headache  Patient was referred by ophthalmologist Dr. Herbert Deaner to neurologist Dr. Forest Gleason who saw patient on June 03, 2021, documented double vision, right 6th nerve palsy, who suggested patient continue follow-up with our clinic  Patient's mother reported that he seems to have worsening abnormal eye movement, patient could not provide clear history, he denies significant headache now, able to continue to work his part-time job at [employer],  UPDATE Jul 08 2021:  His headache overall has improved, reported progressive worsening stuffy nose, mouth breathing, was seen by [facility] neurosurgeon Dr. Tommi Rumps June 24, 2021, further work-up to finalize the diagnosis is pending  Bone scan in Dec 2022 1.  Focal tracer uptake corresponding to clival lesion on prior MRI. 2.  Otherwise no scintigraphic evidence of metastatic disease.   No evidence of primary neoplasm or metastatic disease in the chest,abdomen, or pelvis.  1.  Focal tracer uptake corresponding to clival lesion on prior MRI. 2.  Otherwise no scintigraphic evidence of metastatic disease.   CT chest, abdomen, pelvic, showed no evidence of metastatic disease or primary neoplasma  Lab in Dec 2022: Hg 12.7, Cal 9.4, creat 1.0, Ig free light chain Kappa 2.92  (elevated), normal lamba,   Normal IFE,  beta-2 microglobulin 1.63,   [facility] neurosurgeon evaluation by Dr. Yetta Glassman on June 24, 2021, 6th nerve palsy to right, full visual field to confrontation, MRI scan extensive ablation of his clivus, not Multicyst,  pituitary gland looks normal,  Going to get blood and urine test for plasmacytoma, complete blood count with differentiation to be certain he does not have lymphoma or leukemia,  Bone scan to see if there are any other lesion,  CT scan of the chest and abdomen   The lesion looks very extensive, like to have more information before any intervention  Update 08/26/22 SS: Diagnosed with nasopharyngeal carcinoma.  April 2023 for chemotherapy, radiation plan is palliative.   01/19/22 MRI brain - nasopharyngeal mass 4.1 x 7.8 cm --> 3.6 x 7.5 x 5.2 cm. Encases the anterior C1 arch with extension into the atlantodental space. Extends into the bilat cavernous sinus with encasement of bilat cavernous segment of ICAs. Also encasement of the right horizontal segment ICA. Extends into right Meckel's cave and demonstrates invasion into the floor of the bilat middle cranial fossa, R>L. Invades the sella, sphenoid sinus, posterior ethmoid sinuses. Necrosis within the central aspect of the mass.   07/03/2022: Destructive lesion measures 3.5 x 3.7 cm compared to previous 6.8 x 4.2 cm on comparison 01/19/2022 in craniocaudal and AP dimensions respectively.   PHYSICAL EXAM:   There were no vitals filed for this visit.  Not recorded     There is no height or weight on  file to calculate BMI.  PHYSICAL EXAMNIATION:  Gen: NAD, conversant, well nourised, well groomed           NEUROLOGICAL EXAM:  MENTAL STATUS: Speech/cognition:  Slow reaction, limited word of choice, cooperative, nasal voice, CRANIAL NERVES: CN II: Visual fields are full to confrontation. Pupils are round equal and briskly reactive to light. CN III, IV, VI: Bilateral abductor  weakness, CN V: Facial sensation is intact to light touch CN VII: Face is symmetric with normal eye closure  CN VIII: Hearing is normal to causal conversation. CN IX, X: Phonation is normal. CN XI: Head turning and shoulder shrug are intact  MOTOR: There is no pronator drift of out-stretched arms. Muscle bulk and tone are normal. Muscle strength is normal.  REFLEXES: Reflexes are 2+ and symmetric at the biceps, triceps, knees, and ankles. Plantar responses are flexor.  SENSORY: Intact to light touch, pinprick and vibratory sensation are intact in fingers and toes.  COORDINATION: There is no trunk or limb dysmetria noted.  GAIT/STANCE: Able to get up from seated position, cautious gait gait, skull-based tends to close 1 eye while ambulating,  REVIEW OF SYSTEMS:  Full 14 system review of systems performed and notable only for as above All other review of systems were negative.   ALLERGIES: No Known Allergies  HOME MEDICATIONS: Current Outpatient Medications  Medication Sig Dispense Refill   atorvastatin (LIPITOR) 10 MG tablet Take 10 mg by mouth daily.     benztropine (COGENTIN) 1 MG tablet Take 1 mg by mouth 2 (two) times daily.       cetirizine (ZYRTEC ALLERGY) 10 MG tablet Take 1 tablet (10 mg total) by mouth daily. 30 tablet 1   diphenhydrAMINE (BENADRYL) 25 MG tablet Take 1 tablet (25 mg total) by mouth every 6 (six) hours. 30 tablet 0   divalproex (DEPAKOTE ER) 500 MG 24 hr tablet TAKE 1 TABLET(500 MG) BY MOUTH AT BEDTIME 30 tablet 0   fluticasone (FLONASE) 50 MCG/ACT nasal spray Place 1 spray into both nostrils daily. 16 g 1   loratadine (CLARITIN) 10 MG tablet Take 10 mg by mouth daily.     losartan-hydrochlorothiazide (HYZAAR) 50-12.5 MG tablet Take by mouth.     montelukast (SINGULAIR) 10 MG tablet Take 10 mg by mouth daily.     naproxen sodium (ALEVE) 220 MG tablet Take 1 tablet (220 mg total) by mouth 2 (two) times daily as needed. 60 tablet 0   omeprazole  (PRILOSEC) 20 MG capsule Take 1 capsule (20 mg total) by mouth daily. 30 capsule 0   ondansetron (ZOFRAN ODT) 4 MG disintegrating tablet Take 1 tablet (4 mg total) by mouth every 8 (eight) hours as needed. 20 tablet 6   risperiDONE (RISPERDAL) 0.5 MG tablet Take 0.5 mg by mouth at bedtime.     risperiDONE microspheres (RISPERDAL CONSTA) 50 MG injection Inject into the muscle.     rizatriptan (MAXALT-MLT) 10 MG disintegrating tablet Take 1 tablet (10 mg total) by mouth as needed for migraine. May repeat in 2 hours if needed 12 tablet 0   No current facility-administered medications for this visit.    PAST MEDICAL HISTORY: Past Medical History:  Diagnosis Date   Headache    Hearing loss    Hyperlipidemia    Hypertension    Schizophrenia, paranoid type ([place])     PAST SURGICAL HISTORY: No past surgical history on file.  FAMILY HISTORY: Family History  Problem Relation Age of Onset   Healthy Mother  Diabetes Father    Stroke Maternal Uncle     SOCIAL HISTORY: Social History   Socioeconomic History   Marital status: Legally Separated    Spouse name: Not on file   Number of children: Not on file   Years of education: Not on file   Highest education level: Not on file  Occupational History   Not on file  Tobacco Use   Smoking status: Every Day    Packs/day: 1.00    Types: Cigarettes   Smokeless tobacco: Never  Substance and Sexual Activity   Alcohol use: Yes    Comment: light   Drug use: No   Sexual activity: Not on file  Other Topics Concern   Not on file  Social History Narrative   Lives alone   Right handed   Social Determinants of Health   Financial Resource Strain: Not on file  Food Insecurity: Not on file  Transportation Needs: Not on file  Physical Activity: Not on file  Stress: Not on file  Social Connections: Not on file  Intimate Partner Violence: Not on file   Butler Denmark, Laqueta Jean, [facility] [address], [place], [place] [address] [phone]

## 2022-08-26 ENCOUNTER — Ambulatory Visit (INDEPENDENT_AMBULATORY_CARE_PROVIDER_SITE_OTHER): Payer: 59 | Admitting: Neurology

## 2022-08-26 ENCOUNTER — Encounter: Payer: Self-pay | Admitting: Neurology

## 2022-08-26 VITALS — BP 113/74 | HR 91 | Ht 72.0 in | Wt 182.0 lb

## 2022-08-26 DIAGNOSIS — S0440XS Injury of abducent nerve, unspecified side, sequela: Secondary | ICD-10-CM | POA: Diagnosis not present

## 2022-08-26 DIAGNOSIS — D496 Neoplasm of unspecified behavior of brain: Secondary | ICD-10-CM

## 2022-08-26 DIAGNOSIS — J392 Other diseases of pharynx: Secondary | ICD-10-CM

## 2022-08-26 DIAGNOSIS — G43709 Chronic migraine without aura, not intractable, without status migrainosus: Secondary | ICD-10-CM

## 2022-08-26 MED ORDER — DIVALPROEX SODIUM ER 500 MG PO TB24: ORAL_TABLET | ORAL | 1 refills | Status: DC

## 2022-08-26 NOTE — Patient Instructions (Signed)
I will refill your Depakote for headache prevention  See you back in 8 months

## 2022-10-02 ENCOUNTER — Ambulatory Visit (HOSPITAL_COMMUNITY)
Admission: EM | Admit: 2022-10-02 | Discharge: 2022-10-02 | Disposition: A | Discharge status: Home / Self Care | Payer: 59 | Attending: Emergency Medicine | Admitting: Emergency Medicine

## 2022-10-02 ENCOUNTER — Encounter (HOSPITAL_COMMUNITY): Payer: Self-pay

## 2022-10-02 DIAGNOSIS — U071 COVID-19: Secondary | ICD-10-CM | POA: Diagnosis not present

## 2022-10-02 MED ORDER — PAXLOVID (300/100) 20 X 150 MG & 10 X 100MG PO TBPK: 3.0000 | ORAL_TABLET | Freq: Two times a day (BID) | ORAL | 0 refills | Status: AC

## 2022-10-02 MED ORDER — PROMETHAZINE-DM 6.25-15 MG/5ML PO SYRP: 5.0000 mL | ORAL_SOLUTION | Freq: Four times a day (QID) | ORAL | 0 refills | Status: DC | PRN

## 2022-10-02 NOTE — ED Provider Notes (Signed)
[place]    CSN: [id number] Arrival date & time: 10/02/22  1621    HISTORY   Chief Complaint  Patient presents with   Covid Positive   Cough   Nasal Congestion   HPI [name] is a pleasant, 52 y.o. male who presents to urgent care today. Pt reports he is covid positive and would like to be vaccinated. Pt reports nasal congestion, coughing and body aches x2-3 days.   The history is provided by the patient.   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 No pertinent surgical history.  Home Medications    Prior to Admission medications   Medication Sig Start Date End Date Taking? Authorizing Provider  atorvastatin (LIPITOR) 10 MG tablet Take 10 mg by mouth daily. 05/30/20  Yes [provider]  benztropine (COGENTIN) 1 MG tablet Take 1 mg by mouth 2 (two) times daily.     Yes [provider]  benztropine mesylate (COGENTIN) 1 MG/ML injection Inject 1 mg into the muscle once.   Yes [provider]  cetirizine (ZYRTEC ALLERGY) 10 MG tablet Take 1 tablet (10 mg total) by mouth daily. 06/20/20  Yes Volney American, PA-C  diphenhydrAMINE (BENADRYL) 25 MG tablet Take 1 tablet (25 mg total) by mouth every 6 (six) hours. 04/25/21  Yes Drenda Freeze, MD  divalproex (DEPAKOTE ER) 500 MG 24 hr tablet TAKE 1 TABLET(500 MG) BY MOUTH AT BEDTIME 08/26/22  Yes Suzzanne Cloud, NP  fluticasone [facility]) 50 MCG/ACT nasal spray Place 1 spray into both nostrils daily. 06/20/20  Yes Volney American, PA-C   loratadine (CLARITIN) 10 MG tablet Take 10 mg by mouth daily. 11/14/20  Yes [provider]  losartan-hydrochlorothiazide (HYZAAR) 50-12.5 MG tablet Take by mouth. 11/29/19  Yes [provider]  montelukast (SINGULAIR) 10 MG tablet Take 10 mg by mouth daily. 05/30/20  Yes [provider]  naproxen sodium (ALEVE) 220 MG tablet Take 1 tablet (220 mg total) by mouth 2 (two) times daily as needed. 04/25/21  Yes Drenda Freeze, MD  omeprazole (PRILOSEC) 20 MG capsule Take 1 capsule (20 mg total) by mouth daily. 08/03/15  Yes Jola Schmidt, MD  ondansetron (ZOFRAN ODT) 4 MG disintegrating tablet Take 1 tablet (4 mg total) by mouth every 8 (eight) hours as needed. 04/22/21  Yes Marcial Pacas, MD  risperiDONE (RISPERDAL) 0.5 MG tablet Take 0.5 mg by mouth at bedtime. 12/25/20  Yes [provider]  risperiDONE microspheres (RISPERDAL CONSTA) 50 MG injection Inject into the muscle. 11/16/19  Yes [provider]  rizatriptan (MAXALT-MLT) 10 MG disintegrating tablet Take 1 tablet (10 mg total) by mouth as needed for migraine. May repeat in 2 hours if needed 04/25/21  Yes Drenda Freeze, MD    Family History Family History  Problem Relation Age of Onset   Healthy Mother    Diabetes Father    Stroke Maternal Uncle    Social History Social History   Tobacco Use   Smoking status: Every Day    Packs/day:  1    Types: Cigarettes   Smokeless tobacco: Never  Substance Use Topics   Alcohol use: Yes    Comment: light   Drug use: No   Allergies   Patient has no known allergies.  Review of Systems Review of Systems Pertinent findings revealed after performing a 14 point review of systems has been noted in the history of present illness.  Physical Exam Vital Signs BP (!) 134/90 (BP Location: Left Arm)   Pulse 82   Temp 98.1 F (36.7 C) (Oral)   Resp 16   SpO2 98%   No data found.  Physical Exam  Visual Acuity Right Eye Distance:   Left Eye  Distance:   Bilateral Distance:    Right Eye Near:   Left Eye Near:    Bilateral Near:     UC Couse / Diagnostics / Procedures:     Radiology No results found.  Procedures Procedures (including critical care time) EKG  Pending results:  Labs Reviewed - No data to display  Medications Ordered in UC: Medications - No data to display  UC Diagnoses / Final Clinical Impressions(s)   I have reviewed the triage vital signs and the nursing notes.  Pertinent labs & imaging results that were available during my care of the patient were reviewed by me and considered in my medical decision making (see chart for details).    Final diagnoses:  COVID-19   *** Please see discharge instructions below for further details of plan of care as provided to patient. ED Prescriptions     Medication Sig Dispense Auth. Provider   nirmatrelvir & ritonavir (PAXLOVID, 300/100,) 20 x 150 MG & 10 x 100MG  TBPK Take 3 tablets by mouth 2 (two) times daily for 5 days. 30 tablet Lynden Oxford Scales, PA-C   promethazine-dextromethorphan (PROMETHAZINE-DM) 6.25-15 MG/5ML syrup Take 5 mLs by mouth 4 (four) times daily as needed for cough. 118 mL Lynden Oxford Scales, PA-C      PDMP not reviewed this encounter.  Disposition Upon Discharge:  Condition: stable for discharge home Home: take medications as prescribed; routine discharge instructions as discussed; follow up as advised.  Patient presented with an acute illness with associated systemic symptoms and significant discomfort requiring urgent management. In my opinion, this is a condition that a prudent lay person (someone who possesses an average knowledge of health and medicine) may potentially expect to result in complications if not addressed urgently such as respiratory distress, impairment of bodily function or dysfunction of bodily organs.   Routine symptom specific, illness specific and/or disease specific instructions were discussed with the  patient and/or caregiver at length.   As such, the patient has been evaluated and assessed, work-up was performed and treatment was provided in alignment with urgent care protocols and evidence based medicine.  Patient/parent/caregiver has been advised that the patient may require follow up for further testing and treatment if the symptoms continue in spite of treatment, as clinically indicated and appropriate.  If the patient was tested for COVID-19, Influenza and/or RSV, then the patient/parent/guardian was advised to isolate at home pending the results of his/her diagnostic coronavirus test and potentially longer if they're positive. I have also advised pt that if his/her COVID-19 test returns positive, it's recommended to self-isolate for at least 10 days after symptoms first appeared AND until fever-free for 24 hours without fever reducer AND other symptoms have improved or resolved. Discussed self-isolation recommendations as well as instructions for household member/close contacts as per the [facility]  and [place] DHHS, and also gave patient the [place] packet with this information.  Patient/parent/caregiver has been advised to return to the [facility] or PCP in 3-5 days if no better; to PCP or the Emergency Department if new signs and symptoms develop, or if the current signs or symptoms continue to change or worsen for further workup, evaluation and treatment as clinically indicated and appropriate  The patient will follow up with their current PCP if and as advised. If the patient does not currently have a PCP we will assist them in obtaining one.   The patient may need specialty follow up if the symptoms continue, in spite of conservative treatment and management, for further workup, evaluation, consultation and treatment as clinically indicated and appropriate.  Patient/parent/caregiver verbalized understanding and agreement of plan as discussed.  All questions were addressed during visit.  Please see discharge  instructions below for further details of plan.  Discharge Instructions:   Discharge Instructions      I have enclosed some information about COVID-19.  It is recommended that you receive the COVID-19 vaccine 3 months after COVID-19 infection.  Please reach out to your local pharmacy to make an appointment sometime in June to have this done.  I recommend that you begin taking Paxlovid because you are currently receiving chemotherapy.  I sent a prescription to your pharmacy.  Please take 3 tablets in the morning and 3 tablets in the evening for the next 5 days.  Paxlovid is an antiviral medication which will reduce the amount of virus in your body, slow the progression of the disease down and improve your symptoms.  For your cough, please take 5 mL of promethazine up to 4 times daily as needed.  Thank you for visiting urgent care today.      This office note has been dictated using Museum/gallery curator.  Unfortunately, this method of dictation can sometimes lead to typographical or grammatical errors.  I apologize for your inconvenience in advance if this occurs.  Please do not hesitate to reach out to me if clarification is needed.

## 2022-10-02 NOTE — Discharge Instructions (Signed)
I have enclosed some information about COVID-19.  It is recommended that you receive the COVID-19 vaccine 3 months after COVID-19 infection.  Please reach out to your local pharmacy to make an appointment sometime in June to have this done.  I recommend that you begin taking Paxlovid because you are currently receiving chemotherapy.  I sent a prescription to your pharmacy.  Please take 3 tablets in the morning and 3 tablets in the evening for the next 5 days.  Paxlovid is an antiviral medication which will reduce the amount of virus in your body, slow the progression of the disease down and improve your symptoms.  For your cough, please take 5 mL of promethazine up to 4 times daily as needed.  Thank you for visiting urgent care today.

## 2022-10-02 NOTE — ED Triage Notes (Signed)
Pt reports he is covid positive and would like to be vaccinated. Pt reports nasal congestion, coughing and body aches x2-3 days.

## 2022-10-06 ENCOUNTER — Other Ambulatory Visit: Payer: Self-pay | Admitting: Neurology

## 2023-02-04 ENCOUNTER — Other Ambulatory Visit (HOSPITAL_COMMUNITY): Payer: Self-pay

## 2023-02-05 ENCOUNTER — Other Ambulatory Visit (HOSPITAL_COMMUNITY): Payer: Self-pay

## 2023-02-05 MED ORDER — DIVALPROEX SODIUM ER 500 MG PO TB24
500.0000 mg | ORAL_TABLET | Freq: Every day | ORAL | 1 refills | Status: DC
  Filled 2023-03-13: qty 90, 90d supply, fill #0

## 2023-02-05 MED ORDER — ONDANSETRON HCL 8 MG PO TABS: 8.0000 mg | ORAL_TABLET | Freq: Three times a day (TID) | ORAL | 3 refills | Status: AC | PRN

## 2023-02-05 MED ORDER — LORATADINE 10 MG PO TABS
10.0000 mg | ORAL_TABLET | Freq: Every day | ORAL | 3 refills | Status: DC
  Filled 2023-03-09: qty 90, 90d supply, fill #0
  Filled 2023-06-04 (×2): qty 90, 90d supply, fill #1

## 2023-02-06 ENCOUNTER — Other Ambulatory Visit (HOSPITAL_COMMUNITY): Payer: Self-pay

## 2023-02-06 MED ORDER — FLUTICASONE PROPIONATE 50 MCG/ACT NA SUSP: 1.0000 | Freq: Every day | NASAL | 3 refills | Status: AC | PRN

## 2023-02-06 MED ORDER — LOSARTAN POTASSIUM-HCTZ 50-12.5 MG PO TABS
1.0000 | ORAL_TABLET | Freq: Every day | ORAL | 3 refills | Status: DC
  Filled 2023-02-19 – 2023-03-01 (×2): qty 90, 90d supply, fill #0

## 2023-02-06 MED ORDER — MAGNESIUM OXIDE -MG SUPPLEMENT 400 (240 MG) MG PO TABS
400.0000 mg | ORAL_TABLET | Freq: Every day | ORAL | 5 refills | Status: DC
  Filled 2023-02-19 – 2023-03-03 (×2): qty 30, 30d supply, fill #0
  Filled 2023-04-05: qty 30, 30d supply, fill #1
  Filled 2023-04-28: qty 30, 30d supply, fill #2
  Filled 2023-05-17: qty 30, 30d supply, fill #3
  Filled 2023-06-04 – 2023-06-08 (×2): qty 30, 30d supply, fill #4

## 2023-02-06 MED ORDER — POTASSIUM CHLORIDE ER 20 MEQ PO TBCR
20.0000 meq | EXTENDED_RELEASE_TABLET | Freq: Two times a day (BID) | ORAL | 3 refills | Status: DC
  Filled 2023-02-09: qty 60, 30d supply, fill #0

## 2023-02-09 ENCOUNTER — Other Ambulatory Visit (HOSPITAL_COMMUNITY): Payer: Self-pay

## 2023-02-19 ENCOUNTER — Other Ambulatory Visit (HOSPITAL_COMMUNITY): Payer: Self-pay

## 2023-02-19 ENCOUNTER — Encounter (HOSPITAL_COMMUNITY): Payer: Self-pay | Admitting: Pharmacist

## 2023-02-20 ENCOUNTER — Other Ambulatory Visit (HOSPITAL_COMMUNITY): Payer: Self-pay

## 2023-02-22 ENCOUNTER — Encounter: Payer: Self-pay | Admitting: Pharmacist

## 2023-02-22 ENCOUNTER — Other Ambulatory Visit: Payer: Self-pay

## 2023-02-25 ENCOUNTER — Other Ambulatory Visit: Payer: Self-pay

## 2023-03-01 ENCOUNTER — Other Ambulatory Visit (HOSPITAL_COMMUNITY): Payer: Self-pay

## 2023-03-01 ENCOUNTER — Other Ambulatory Visit: Payer: Self-pay

## 2023-03-01 MED ORDER — POTASSIUM CHLORIDE ER 20 MEQ PO TBCR
1.0000 | EXTENDED_RELEASE_TABLET | Freq: Two times a day (BID) | ORAL | 3 refills | Status: DC
  Filled 2023-03-03: qty 60, 30d supply, fill #0
  Filled 2023-03-23: qty 60, 30d supply, fill #1
  Filled 2024-01-24: qty 60, 30d supply, fill #2

## 2023-03-03 ENCOUNTER — Other Ambulatory Visit (HOSPITAL_COMMUNITY): Payer: Self-pay

## 2023-03-03 ENCOUNTER — Other Ambulatory Visit: Payer: Self-pay

## 2023-03-04 ENCOUNTER — Other Ambulatory Visit: Payer: Self-pay

## 2023-03-09 ENCOUNTER — Other Ambulatory Visit: Payer: Self-pay

## 2023-03-09 ENCOUNTER — Other Ambulatory Visit (HOSPITAL_COMMUNITY): Payer: Self-pay

## 2023-03-09 ENCOUNTER — Other Ambulatory Visit (HOSPITAL_BASED_OUTPATIENT_CLINIC_OR_DEPARTMENT_OTHER): Payer: Self-pay

## 2023-03-13 ENCOUNTER — Other Ambulatory Visit (HOSPITAL_COMMUNITY): Payer: Self-pay

## 2023-03-23 ENCOUNTER — Other Ambulatory Visit (HOSPITAL_COMMUNITY): Payer: Self-pay

## 2023-04-05 ENCOUNTER — Other Ambulatory Visit (HOSPITAL_COMMUNITY): Payer: Self-pay

## 2023-04-21 ENCOUNTER — Other Ambulatory Visit (HOSPITAL_COMMUNITY): Payer: Self-pay

## 2023-04-21 ENCOUNTER — Other Ambulatory Visit: Payer: Self-pay

## 2023-04-21 MED ORDER — POTASSIUM CHLORIDE ER 20 MEQ PO TBCR
1.0000 | EXTENDED_RELEASE_TABLET | Freq: Two times a day (BID) | ORAL | 3 refills | Status: DC
  Filled 2023-04-21: qty 180, 90d supply, fill #0

## 2023-04-26 ENCOUNTER — Other Ambulatory Visit (HOSPITAL_COMMUNITY): Payer: Self-pay

## 2023-04-26 ENCOUNTER — Encounter: Payer: Self-pay | Admitting: Neurology

## 2023-04-26 ENCOUNTER — Ambulatory Visit (INDEPENDENT_AMBULATORY_CARE_PROVIDER_SITE_OTHER): Payer: 59 | Admitting: Neurology

## 2023-04-26 VITALS — BP 138/90 | HR 73 | Ht 72.0 in | Wt 198.0 lb

## 2023-04-26 DIAGNOSIS — J392 Other diseases of pharynx: Secondary | ICD-10-CM | POA: Diagnosis not present

## 2023-04-26 DIAGNOSIS — H4921 Sixth [abducent] nerve palsy, right eye: Secondary | ICD-10-CM | POA: Insufficient documentation

## 2023-04-26 DIAGNOSIS — G43709 Chronic migraine without aura, not intractable, without status migrainosus: Secondary | ICD-10-CM

## 2023-04-26 MED ORDER — DIVALPROEX SODIUM ER 500 MG PO TB24
500.0000 mg | ORAL_TABLET | Freq: Every day | ORAL | 3 refills | Status: DC
  Filled 2023-04-26 – 2023-06-04 (×3): qty 90, 90d supply, fill #0

## 2023-04-26 NOTE — Progress Notes (Signed)
Chief Complaint  Patient presents with   Follow-up    Rm 14. Patient alone, Migraines come every once in a while, he states oxycodone and morphine are what help the best     ASSESSMENT AND PLAN  [name] is a 52 y.o. male  Nasopharyngeal carcinoma,  Local metastatic, under the care of [facility] oncology, have monthly MRI of the brain with without contrast, status post induction followed by concurrent chemotherapy, now on palliative chemotherapy and immunotherapy therapy for recurrent progressive disease  Occasionally headache, has been helped by Depakote ER 500 mg every night as preventive medications, using hydrocodone or Dilaudid as needed for abortive treatment, prescription was from [facility],  Continue follow-up with [facility] oncology, refilled her Depakote ER 500 mg today, later refill can be done by [facility] physician  DIAGNOSTIC DATA (LABS, IMAGING, TESTING) - I reviewed patient records, labs, notes, testing and imaging myself where available.   MEDICAL HISTORY:  [name] is a 52 year old male, seen in request by his primary care physician Dr. Parke Simmers, Veita adduction of frequent migraine headache, he is accompanied by his case manager Hinda Kehr, who has known him for 3 years at today's visit on Oct/05/2021  I reviewed and summarized the referring note. PMHx. HTN HLD.  If you miss an appointment you reschedule another appointment for subjective chart 4 years and and no daycare attendance parents.  She had 15 x-rays.  And to get up 15 g right and so But at today daily gait instability: Alert colonoscope canceled appointment which is something that should go because if there is no appointment scheduled date: Take up to 5 L O2 he had a COVID completed 2:00 today consented will need appointment still aimovig will send an appointment of some neck no prescription  Helpful  Reviewed follow-up with headaches earlier today.  Okay Continue adjunctively having any significant  regularity neck: Respiratory: Clear:, Ongoing  Most of the time.   Paranoid Schizophrenia,    He has lifelong history of paranoid schizophrenia, under stable control taking Risperdal injection 50 mg, along with risperidone tablet 0.5 mg every night, he lives at home, works part-time job, washing dishes at [employer]  He complains of severe headache since 2021, denies a history of headache before that, his headache described as right retro-orbital area severe pain, pounding, extending to right face, lasting hours, it happened couple times each week  I personally reviewed CT head without contrast March 2022, no acute abnormality  Because of frequent headache, he has been taking frequent over-the-counter medications, extraneous Tylenol 6 to 8 tablets daily, sometimes Excedrin Migraine 6 tablets,or  ibuprofen,  He denies lateralized motor or sensory deficit, denies double vision   UPDATE Dec 5th 2022: I reviewed ophthalmology evaluation on May 15, 2019 improved, by Dr. Elmer Picker on May 12, 2021, which showed bilateral optic disc edema  I have put the emergency fluoroscopy guided lumbar puncture also ordered MRI of the brain with without contrast  MRI of the brain with and without contrast on May 29, 2021 reported by Dr. Sebastian Ache showed 7 cm central skull base tumor, consideration included but not limited to meningioma, lymphoma, plasmacytoma, nasopharyngeal/sinonasal carcinoma,  Patient had fluoroscopy guided lumbar puncture on June 02, 2021, showed significantly elevated opening pressure 48 cmH2O, spinal fluid testing showed no significant abnormality  Patient is a poor historian, today he relies on his mother to talk, mother reported that for 3 weeks she was not able to talk with patient face-to-face, in  late November, when she saw patient finally, she noticed patient has crossed eyes, on further questioning, patient reported that he has developed double vision at the end  of October, seems to gradually getting worse, he was started on Depakote ER 500 mg since last visit on April 22, 2021, which has helped his headache  Patient was referred by ophthalmologist Dr. Elmer Picker to neurologist Dr. Ephriam Jenkins who saw patient on June 03, 2021, documented double vision, right 6th nerve palsy, who suggested patient continue follow-up with our clinic  Patient's mother reported that he seems to have worsening abnormal eye movement, patient could not provide clear history, he denies significant headache now, able to continue to work his part-time job at [employer],  UPDATE Jul 08 2021: His headache overall has improved, reported progressive worsening stuffy nose, mouth breathing, was seen by [facility] neurosurgeon Dr. Zachery Conch June 24, 2021, further work-up to finalize the diagnosis is pending  Bone scan in Dec 2022 1.  Focal tracer uptake corresponding to clival lesion on prior MRI. 2.  Otherwise no scintigraphic evidence of metastatic disease.   No evidence of primary neoplasm or metastatic disease in the chest,abdomen, or pelvis.  1.  Focal tracer uptake corresponding to clival lesion on prior MRI. 2.  Otherwise no scintigraphic evidence of metastatic disease.   CT chest, abdomen, pelvic, showed no evidence of metastatic disease or primary neoplasma  Lab in Dec 2022: Hg 12.7, Cal 9.4, creat 1.0, Ig free light chain Kappa 2.92 (elevated), normal lamba,   Normal IFE,  beta-2 microglobulin 1.63,   [facility] neurosurgeon evaluation by Dr. Velna Ochs on June 24, 2021, 6th nerve palsy to right, full visual field to confrontation, MRI scan extensive ablation of his clivus, not Multicyst,  pituitary gland looks normal,  Going to get blood and urine test for plasmacytoma, complete blood count with differentiation to be certain he does not have lymphoma or leukemia,  Bone scan to see if there are any other lesion,  CT scan of the chest and abdomen   The lesion looks  very extensive, like to have more information before any intervention  UPDATE Apr 26 2023: He was diagnosed with nasopharyngeal carcinoma, status post induction followed by concurrent chemotherapy, now on palliative chemotherapy and immunotherapy therapy for recurrent progressive disease  MRI April 2023, infiltrative large nasopharyngeal mass involve the clivus, extends intracranially involve the cavernous sinus and right mid cranial fossa, enlarged bilateral cervical lymph node  April to May 2023, induction chemotherapy with carboplatin gemcitabine x3 June to July 2023, concurrent chemotherapy with weekly cisplatin,  Repeat MRI of the brain July 2023, posttreatment change of invasive nasopharyngeal mass, with persistent invasion of bilateral cavernous sinus enhancement of internal carotid arteries, mass demonstrate intracranial extension to the floor of bilateral mid cranial fossa, right greater than left  August to December 2023, CarboTaxol pembrolizumab  MRI neck and brain December 2023, destructive lesion 3.5 x 3.7 decreased prevertebral enhancing component, (see previous 6.8 x 4.2 cm)  January 2024 pembrolizumab maintenance dose  March 2024, MRI of the brain and neck increased heterogeneous enhancing involve the mass and slight increase size of the lesion  April 2023, EBV PCR positive,  MRI brain May 2024, soft tissue mass invasive nasopharyngeal associated destructive change measuring up to 6 x 5.3 x 5.7 stable  July 2024, MRI of the brain unchanged extends of in filtrate if destructive nasopharyngeal mass centered in the skull base and clivus, similar soft tissue involvement of sphenoid tissue extending to sella  turcica obstruction of the left eustachian tube, left mastoid effusion inferior extension into the left hypoglossal canal involvement of the right carotid canal  Repeat MRI brain in August 2024:Mildly decreased size and enhancement of the heterogenous enhancing tissue at the  left clivus (7:45). Redemonstration of extension into the left hypoglossal canal as well is the bilateral pterygopalatine fossa. Extension into the right middle cranial fossa Meckel's cave is also unchanged.   He complains of double vision, headaches, right facial pain, numbness, hearing loss,  He lives alone, came by transportation, he still work as Marine scientist, occasionally headache, 3-4 time a week, Depakote 500 mg every night was helpful, use hydrocodone or  Dilaudid as needed during severe headache   PHYSICAL EXAM:   Vitals:   04/26/23 0847  BP: (!) 138/90  Pulse: 73  Weight: 198 lb (89.8 kg)  Height: 6' (1.829 m)   Not recorded     Body mass index is 26.85 kg/m.  PHYSICAL EXAMNIATION:  Gen: NAD, conversant, well nourised, well groomed           NEUROLOGICAL EXAM:  MENTAL STATUS: Speech/cognition:  Slow reaction, limited word of choice, cooperative, nasal voice, CRANIAL NERVES: CN II: Visual fields are full to confrontation. Pupils are round equal and briskly reactive to light. CN III, IV, VI: Right abductor weakness, CN V: Facial sensation is intact to light touch CN VII: Face is symmetric with normal eye closure  CN VIII: Hearing is normal to causal conversation. CN IX, X: Phonation is normal. CN XI: Head turning and shoulder shrug are intact  MOTOR: There is no pronator drift of out-stretched arms. Muscle bulk and tone are normal. Muscle strength is normal.  REFLEXES: Reflexes are 2+ and symmetric at the biceps, triceps, knees, and ankles. Plantar responses are flexor.  SENSORY: Intact to light touch, pinprick and vibratory sensation are intact in fingers and toes.  COORDINATION: There is no trunk or limb dysmetria noted.  GAIT/STANCE: Able to get up from seated position, cautious gait gait, skull-based tends to close 1 eye while ambulating,  REVIEW OF SYSTEMS:  Full 14 system review of systems performed and notable only for as above All other  review of systems were negative.   ALLERGIES: No Known Allergies  HOME MEDICATIONS: Current Outpatient Medications  Medication Sig Dispense Refill   atorvastatin (LIPITOR) 10 MG tablet Take 10 mg by mouth daily.     benztropine (COGENTIN) 1 MG tablet Take 1 mg by mouth 2 (two) times daily.       benztropine mesylate (COGENTIN) 1 MG/ML injection Inject 1 mg into the muscle once.     cetirizine (ZYRTEC ALLERGY) 10 MG tablet Take 1 tablet (10 mg total) by mouth daily. 30 tablet 1   diphenhydrAMINE (BENADRYL) 25 MG tablet Take 1 tablet (25 mg total) by mouth every 6 (six) hours. 30 tablet 0   divalproex (DEPAKOTE ER) 500 MG 24 hr tablet TAKE 1 TABLET(500 MG) BY MOUTH AT BEDTIME 90 tablet 1   divalproex (DEPAKOTE ER) 500 MG 24 hr tablet Take 1 tablet (500 mg total) by mouth at bedtime. 90 tablet 1   fluticasone (FLONASE) 50 MCG/ACT nasal spray Place 1 spray into both nostrils daily. 16 g 1   fluticasone (FLONASE) 50 MCG/ACT nasal spray Place 1 spray into both nostrils daily as needed. 48 g 3   loratadine (CLARITIN) 10 MG tablet Take 10 mg by mouth daily.     loratadine (CLARITIN) 10 MG tablet Take 1 tablet (10 mg  total) by mouth daily. 90 tablet 3   losartan-hydrochlorothiazide (HYZAAR) 50-12.5 MG tablet Take by mouth.     losartan-hydrochlorothiazide (HYZAAR) 50-12.5 MG tablet Take 1 tablet by mouth daily. 90 tablet 3   magnesium oxide (MAG-OX) 400 (240 Mg) MG tablet Take 1 tablet (400 mg total) by mouth daily. 30 tablet 5   montelukast (SINGULAIR) 10 MG tablet Take 10 mg by mouth daily.     naproxen sodium (ALEVE) 220 MG tablet Take 1 tablet (220 mg total) by mouth 2 (two) times daily as needed. 60 tablet 0   omeprazole (PRILOSEC) 20 MG capsule Take 1 capsule (20 mg total) by mouth daily. 30 capsule 0   ondansetron (ZOFRAN ODT) 4 MG disintegrating tablet Take 1 tablet (4 mg total) by mouth every 8 (eight) hours as needed. 20 tablet 6   ondansetron (ZOFRAN) 8 MG tablet Take 1 tablet (8 mg  total) by mouth every 8 (eight) hours as needed for nausea/vomiting. 90 tablet 3   Potassium Chloride ER 20 MEQ TBCR Take 1 tablet (20 mEq total) by mouth 2 (two) times daily. 60 tablet 3   Potassium Chloride ER 20 MEQ TBCR Take 1 tablet (20 mEq total) by mouth 2 (two) times daily 180 tablet 3   promethazine-dextromethorphan (PROMETHAZINE-DM) 6.25-15 MG/5ML syrup Take 5 mLs by mouth 4 (four) times daily as needed for cough. 118 mL 0   risperiDONE (RISPERDAL) 0.5 MG tablet Take 0.5 mg by mouth at bedtime.     risperiDONE microspheres (RISPERDAL CONSTA) 50 MG injection Inject into the muscle.     rizatriptan (MAXALT-MLT) 10 MG disintegrating tablet Take 1 tablet (10 mg total) by mouth as needed for migraine. May repeat in 2 hours if needed 12 tablet 0   No current facility-administered medications for this visit.    PAST MEDICAL HISTORY: Past Medical History:  Diagnosis Date   Headache    Hearing loss    Hyperlipidemia    Hypertension    Schizophrenia, paranoid type ([facility])     PAST SURGICAL HISTORY: No past surgical history on file.  FAMILY HISTORY: Family History  Problem Relation Age of Onset   Healthy Mother    Diabetes Father    Stroke Maternal Uncle     SOCIAL HISTORY: Social History   Socioeconomic History   Marital status: Legally Separated    Spouse name: Not on file   Number of children: Not on file   Years of education: Not on file   Highest education level: Not on file  Occupational History   Not on file  Tobacco Use   Smoking status: Former    Current packs/day: 0.00    Types: Cigarettes    Quit date: 04/12/2023    Years since quitting: 0.0   Smokeless tobacco: Never  Substance and Sexual Activity   Alcohol use: Yes    Comment: light   Drug use: No   Sexual activity: Not on file  Other Topics Concern   Not on file  Social History Narrative   Lives alone   Right handed   Social Determinants of Health   Financial Resource Strain: Not on file  Food  Insecurity: Not on file  Transportation Needs: Not on file  Physical Activity: Not on file  Stress: Not on file  Social Connections: Not on file  Intimate Partner Violence: Not on file    Total time spent reviewing the chart, obtaining history, examined patient, ordering tests, documentation, consultations and family, care coordination was 40 minutes  Levert Feinstein, M.D. Ph.D.  [facility] [address] [place], [place] [address] Ph: [phone] Fax: [phone]  CC:  Georgianne Fick, MD [address] [place],  [place] [address]  Georgianne Fick, MD

## 2023-04-28 ENCOUNTER — Other Ambulatory Visit (HOSPITAL_COMMUNITY): Payer: Self-pay

## 2023-04-29 ENCOUNTER — Other Ambulatory Visit (HOSPITAL_COMMUNITY): Payer: Self-pay

## 2023-05-12 ENCOUNTER — Other Ambulatory Visit: Payer: Self-pay

## 2023-05-12 ENCOUNTER — Other Ambulatory Visit (HOSPITAL_COMMUNITY): Payer: Self-pay

## 2023-05-12 MED ORDER — LEVOTHYROXINE SODIUM 25 MCG PO TABS
25.0000 ug | ORAL_TABLET | Freq: Every morning | ORAL | 1 refills | Status: DC
  Filled 2023-05-12 (×2): qty 30, 30d supply, fill #0

## 2023-05-12 MED ORDER — POTASSIUM CHLORIDE ER 20 MEQ PO TBCR
20.0000 meq | EXTENDED_RELEASE_TABLET | Freq: Two times a day (BID) | ORAL | 3 refills | Status: DC
  Filled 2023-05-12 – 2023-07-22 (×4): qty 180, 90d supply, fill #0
  Filled 2023-10-22: qty 180, 90d supply, fill #1
  Filled 2024-02-29: qty 180, 90d supply, fill #2

## 2023-05-17 ENCOUNTER — Other Ambulatory Visit: Payer: Self-pay

## 2023-06-03 ENCOUNTER — Other Ambulatory Visit (HOSPITAL_COMMUNITY): Payer: Self-pay

## 2023-06-03 ENCOUNTER — Other Ambulatory Visit: Payer: Self-pay

## 2023-06-03 MED ORDER — THYROID 30 MG PO TABS
30.0000 mg | ORAL_TABLET | Freq: Every day | ORAL | 1 refills | Status: DC
  Filled 2023-06-03: qty 30, 30d supply, fill #0

## 2023-06-04 ENCOUNTER — Other Ambulatory Visit (HOSPITAL_COMMUNITY): Payer: Self-pay

## 2023-06-04 ENCOUNTER — Encounter: Payer: Self-pay | Admitting: Pharmacist

## 2023-06-04 ENCOUNTER — Other Ambulatory Visit: Payer: Self-pay

## 2023-06-08 ENCOUNTER — Other Ambulatory Visit (HOSPITAL_COMMUNITY): Payer: Self-pay

## 2023-06-09 ENCOUNTER — Other Ambulatory Visit (HOSPITAL_COMMUNITY): Payer: Self-pay

## 2023-06-09 ENCOUNTER — Other Ambulatory Visit: Payer: Self-pay

## 2023-06-11 ENCOUNTER — Other Ambulatory Visit: Payer: Self-pay

## 2023-06-15 ENCOUNTER — Other Ambulatory Visit (HOSPITAL_COMMUNITY): Payer: Self-pay

## 2023-06-15 ENCOUNTER — Other Ambulatory Visit: Payer: Self-pay

## 2023-06-15 MED ORDER — LOSARTAN POTASSIUM 100 MG PO TABS
100.0000 mg | ORAL_TABLET | Freq: Every day | ORAL | 3 refills | Status: DC
  Filled 2023-06-15: qty 90, 90d supply, fill #0

## 2023-06-15 MED ORDER — LORATADINE 10 MG PO TABS
10.0000 mg | ORAL_TABLET | Freq: Every day | ORAL | 3 refills | Status: DC
  Filled 2023-06-15 – 2023-12-02 (×3): qty 90, 90d supply, fill #0
  Filled 2024-02-29: qty 90, 90d supply, fill #1
  Filled 2024-05-29: qty 90, 90d supply, fill #2

## 2023-06-15 MED ORDER — LOSARTAN POTASSIUM 50 MG PO TABS
50.0000 mg | ORAL_TABLET | Freq: Every day | ORAL | 3 refills | Status: DC
  Filled 2023-06-15: qty 90, 90d supply, fill #0
  Filled 2023-09-10: qty 90, 90d supply, fill #1
  Filled 2023-12-15: qty 90, 90d supply, fill #2
  Filled 2024-03-17: qty 90, 90d supply, fill #3

## 2023-06-15 MED ORDER — ATORVASTATIN CALCIUM 40 MG PO TABS
40.0000 mg | ORAL_TABLET | Freq: Every day | ORAL | 3 refills | Status: DC
  Filled 2023-06-15: qty 90, 90d supply, fill #0
  Filled 2023-09-15: qty 90, 90d supply, fill #1
  Filled 2023-12-20: qty 90, 90d supply, fill #2
  Filled 2024-03-29: qty 90, 90d supply, fill #3

## 2023-06-15 MED ORDER — HYDROCHLOROTHIAZIDE 12.5 MG PO TABS
12.5000 mg | ORAL_TABLET | Freq: Every day | ORAL | 3 refills | Status: DC | PRN
  Filled 2023-06-15: qty 90, 90d supply, fill #0

## 2023-06-15 MED ORDER — DIVALPROEX SODIUM ER 500 MG PO TB24
500.0000 mg | ORAL_TABLET | Freq: Every day | ORAL | 3 refills | Status: DC
  Filled 2023-06-15 – 2023-09-16 (×2): qty 90, 90d supply, fill #0
  Filled 2023-12-20: qty 90, 90d supply, fill #1
  Filled 2024-03-31: qty 90, 90d supply, fill #2

## 2023-06-23 ENCOUNTER — Other Ambulatory Visit (HOSPITAL_COMMUNITY): Payer: Self-pay

## 2023-06-23 MED ORDER — LEVOTHYROXINE SODIUM 25 MCG PO TABS
25.0000 ug | ORAL_TABLET | Freq: Every day | ORAL | 1 refills | Status: DC
  Filled 2023-06-23: qty 90, 90d supply, fill #0
  Filled 2023-08-10 – 2023-08-31 (×2): qty 90, 90d supply, fill #1

## 2023-06-30 ENCOUNTER — Other Ambulatory Visit (HOSPITAL_COMMUNITY): Payer: Self-pay

## 2023-07-08 ENCOUNTER — Other Ambulatory Visit (HOSPITAL_COMMUNITY): Payer: Self-pay

## 2023-07-08 ENCOUNTER — Other Ambulatory Visit: Payer: Self-pay | Admitting: Registered Nurse

## 2023-07-08 ENCOUNTER — Other Ambulatory Visit: Payer: Self-pay

## 2023-07-08 MED ORDER — MAGNESIUM OXIDE 400 MG PO TABS
400.0000 mg | ORAL_TABLET | Freq: Two times a day (BID) | ORAL | 5 refills | Status: DC
  Filled 2023-07-08: qty 60, 30d supply, fill #0
  Filled 2023-08-10: qty 60, 30d supply, fill #1
  Filled 2023-09-10: qty 60, 30d supply, fill #2
  Filled 2023-10-15: qty 60, 30d supply, fill #3
  Filled 2023-11-18 (×2): qty 60, 30d supply, fill #4
  Filled 2023-12-20: qty 60, 30d supply, fill #5

## 2023-07-22 ENCOUNTER — Other Ambulatory Visit: Payer: Self-pay

## 2023-07-22 ENCOUNTER — Other Ambulatory Visit (HOSPITAL_COMMUNITY): Payer: Self-pay

## 2023-08-10 ENCOUNTER — Other Ambulatory Visit: Payer: Self-pay

## 2023-08-10 ENCOUNTER — Other Ambulatory Visit (HOSPITAL_COMMUNITY): Payer: Self-pay

## 2023-08-25 ENCOUNTER — Other Ambulatory Visit (HOSPITAL_COMMUNITY): Payer: Self-pay

## 2023-08-25 MED ORDER — LEVOTHYROXINE SODIUM 25 MCG PO TABS
25.0000 ug | ORAL_TABLET | Freq: Every day | ORAL | 3 refills | Status: AC
  Filled 2023-08-25 – 2024-03-06 (×3): qty 90, 90d supply, fill #0
  Filled 2024-06-06: qty 90, 90d supply, fill #1

## 2023-08-30 ENCOUNTER — Other Ambulatory Visit: Payer: Self-pay

## 2023-08-31 ENCOUNTER — Other Ambulatory Visit: Payer: Self-pay

## 2023-08-31 ENCOUNTER — Other Ambulatory Visit (HOSPITAL_COMMUNITY): Payer: Self-pay

## 2023-09-10 ENCOUNTER — Other Ambulatory Visit (HOSPITAL_COMMUNITY): Payer: Self-pay

## 2023-09-15 ENCOUNTER — Other Ambulatory Visit (HOSPITAL_COMMUNITY): Payer: Self-pay

## 2023-09-16 ENCOUNTER — Other Ambulatory Visit (HOSPITAL_COMMUNITY): Payer: Self-pay

## 2023-09-16 ENCOUNTER — Other Ambulatory Visit: Payer: Self-pay

## 2023-09-21 ENCOUNTER — Other Ambulatory Visit (HOSPITAL_COMMUNITY): Payer: Self-pay

## 2023-09-21 IMAGING — MR MR HEAD WO/W CM
12 series · 48 of 48 positions shown · IV contrast (multihance)
Comparison: CT head 10/02/2020.

CLINICAL DATA: Abducent nerve palsy. Right-sided eye and head pain
for greater than 1 year.

EXAM:
MRI HEAD WITHOUT AND WITH CONTRAST
TECHNIQUE: Multiplanar, multiecho pulse sequences of the brain and surrounding
structures were obtained without and with intravenous contrast.
CONTRAST:  18mL MULTIHANCE GADOBENATE DIMEGLUMINE 529 MG/ML IV SOLN

[Series 2: T1 · sagittal · 5.0mm · 0.45mm/px · 1 of 23 slices shown]
[im 1/23]
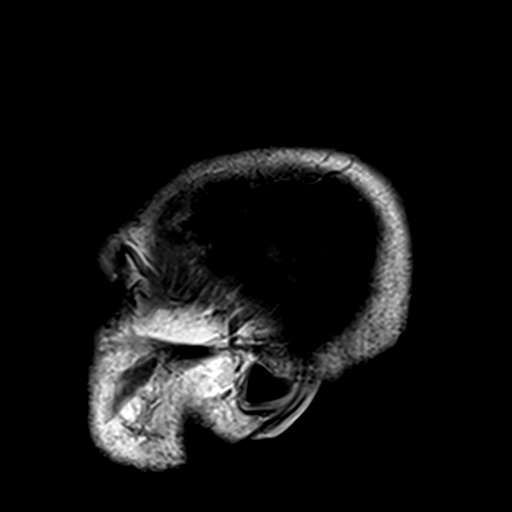

[Series 3: DWI · axial · 3.0mm · 1.80mm/px · z∈[-130,+16]mm · 7 of 100 slices shown (1 of 4)]
[im 1/100]
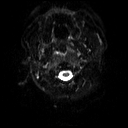
[im 17/100]
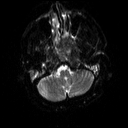
[im 34/100]
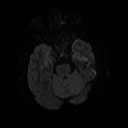
[im 50/100]
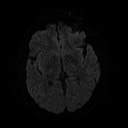
[im 67/100]
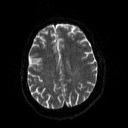
[im 83/100]
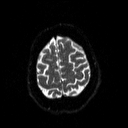
[im 100/100]
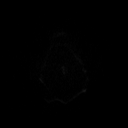

[Series 4: DWI · axial · 3.0mm · 1.80mm/px · z∈[-130,+16]mm · 3 of 45 slices shown (2 of 4)]
[im 1/45]
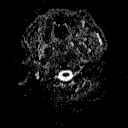
[im 23/45]
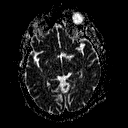
[im 45/45]
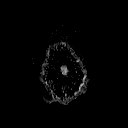

[Series 5: DWI · coronal · 5.0mm · 1.80mm/px · 5 of 74 slices shown (3 of 4)]
[im 1/74]
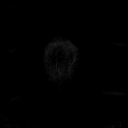
[im 19/74]
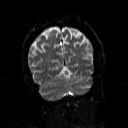
[im 37/74]
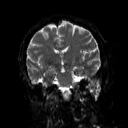
[im 55/74]
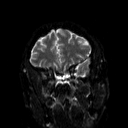
[im 74/74]
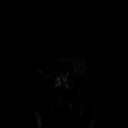

[Series 6: DWI · coronal · 5.0mm · 1.80mm/px · 3 of 37 slices shown (4 of 4)]
[im 1/37]
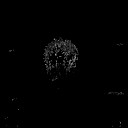
[im 19/37]
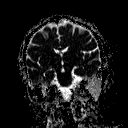
[im 37/37]
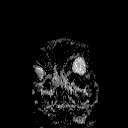

[Series 7: T2 · axial · 5.0mm · 0.60mm/px · 1 of 22 slices shown (1 of 2)]
[im 1/22]
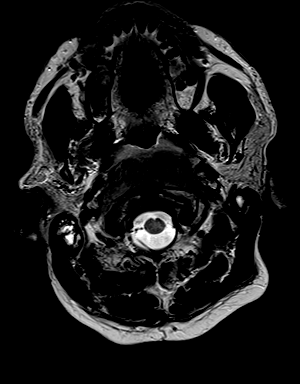

[Series 9: swi_images · axial · 4.0mm · 0.90mm/px · z∈[-126,+13]mm · 2 of 36 slices shown]
[im 1/36]
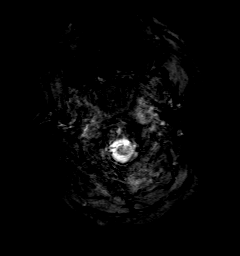
[im 36/36]
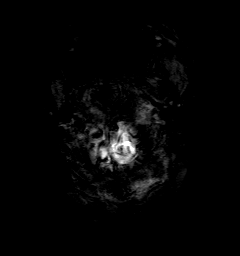

[Series 10: FLAIR · axial · 3.0mm · 0.45mm/px · z∈[-124,+10]mm · 2 of 30 slices shown]
[im 1/30]
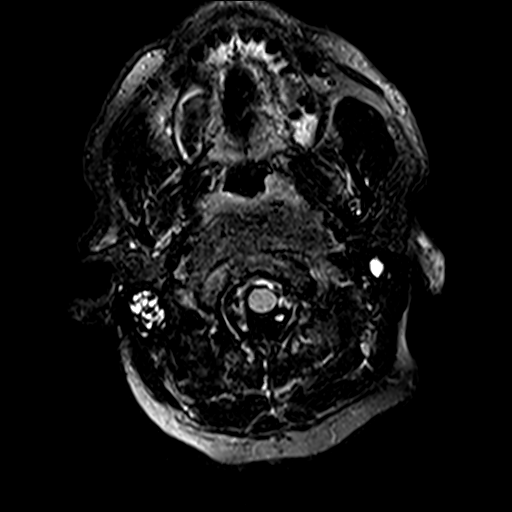
[im 30/30]
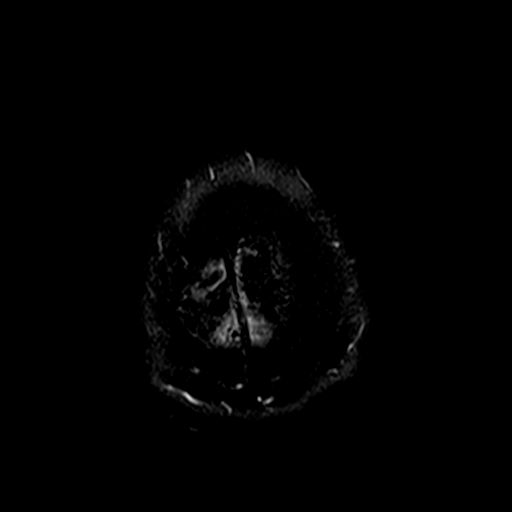

[Series 11: t1_mpr_tra · axial · 1.0mm · 0.75mm/px · z∈[-126,+16]mm · 10 of 144 slices shown]
[im 1/144]
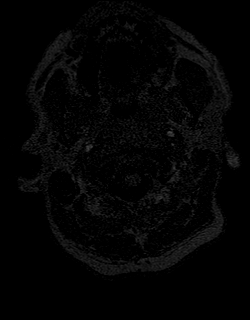
[im 16/144]
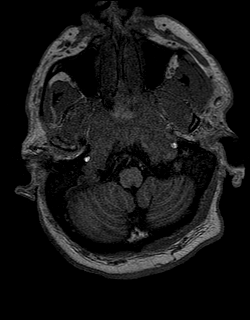
[im 32/144]
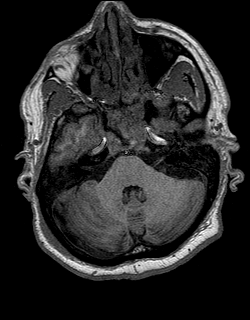
[im 48/144]
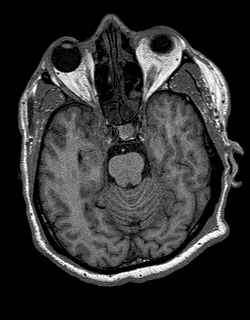
[im 64/144]
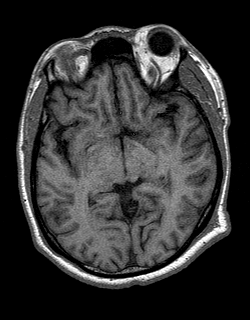
[im 80/144]
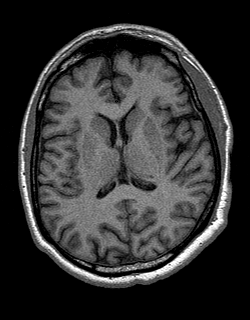
[im 96/144]
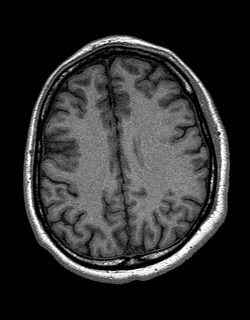
[im 112/144]
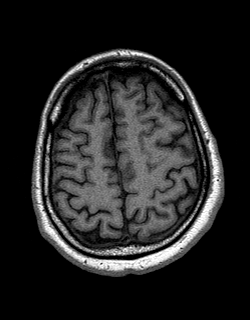
[im 128/144]
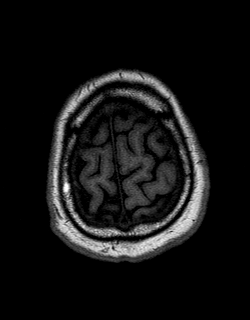
[im 144/144]
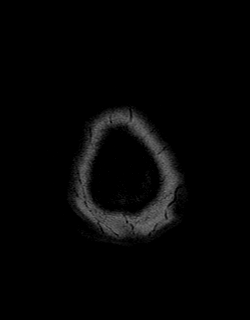

[Series 12: T2 · coronal · 5.0mm · 0.45mm/px · 2 of 27 slices shown (2 of 2)]
[im 1/27]
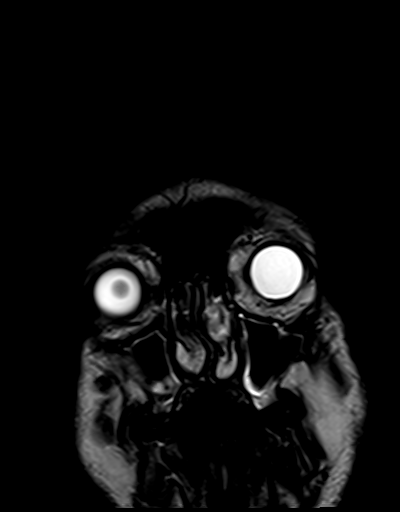
[im 27/27]
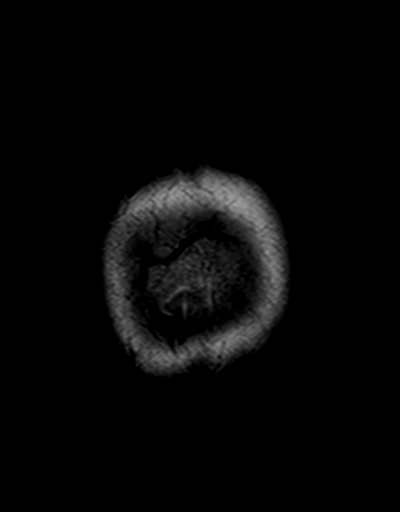

[Series 13: t1_mpr_tra post · axial · 1.0mm · 0.75mm/px · z∈[-126,+16]mm · 10 of 144 slices shown]
[im 1/144]
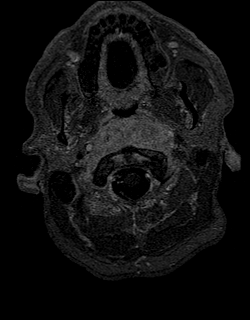
[im 16/144]
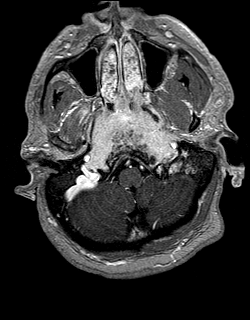
[im 32/144]
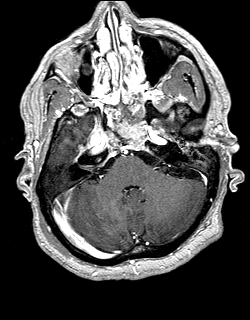
[im 48/144]
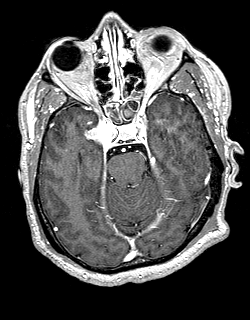
[im 64/144]
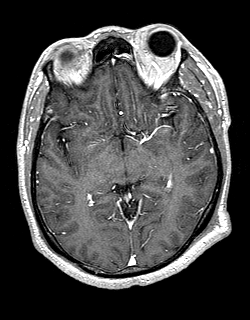
[im 80/144]
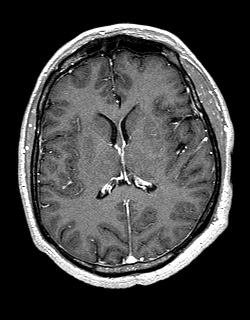
[im 96/144]
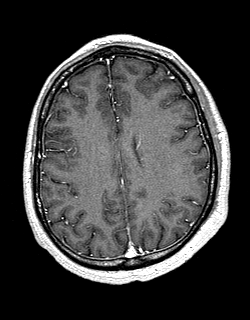
[im 112/144]
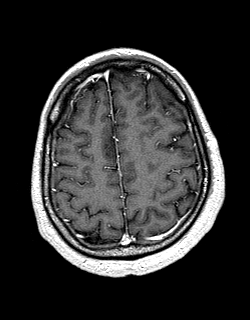
[im 128/144]
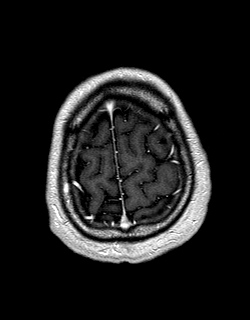
[im 144/144]
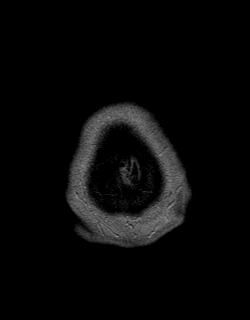

[Series 14: post cor · coronal · 5.0mm · 0.45mm/px · 2 of 27 slices shown]
[im 1/27]
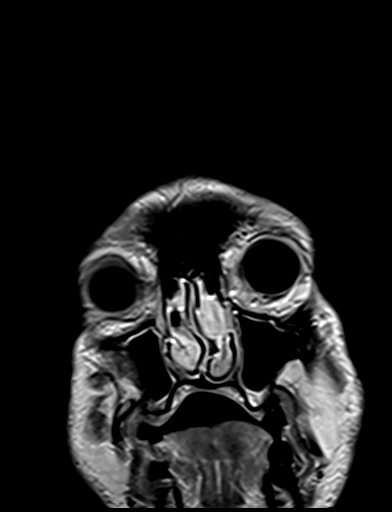
[im 27/27]
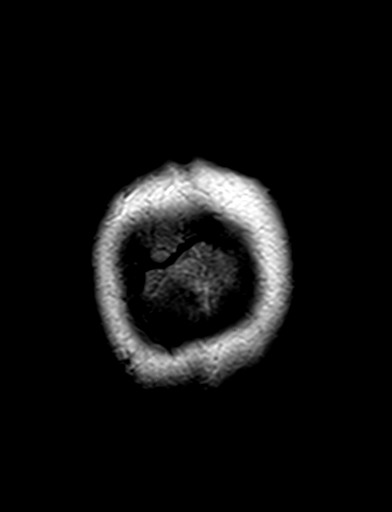

[48 of 48 positions shown; findings below may reference images not displayed]

FINDINGS: Brain: There is no evidence of an acute infarct, intracranial
hemorrhage, intra-axial mass, midline shift, or extra-axial fluid
collection. The ventricles are normal in size. A small focus of T2
hyperintensity medially in the right temporal lobe may reflect
minimal edema from adjacent extra-axial tumor. The brain itself is
normal in signal elsewhere.

Vascular: Major intracranial vascular flow voids are preserved.
Encasement of the right cavernous ICA by tumor with slight narrowing
of its flow void.

Skull and upper cervical spine: There is a large, avidly enhancing
mass involving the central skull base which is incompletely covered
on axial imaging but measures approximately 3.8 x 6.8 x 7.0 cm.
There is regional bone destruction including diffuse involvement of
the clivus with tumor also extending inferiorly and laterally into
the occipital condyles. Tumor extends superiorly into the sphenoid
sinus and fills the nasopharynx with extension into the posterior
aspect of the nasal cavity. A component in the right cavernous sinus
measures 2.8 x 2.3 cm and bulges laterally into the right middle
cranial fossa. There is involvement of multiple skull base foramina
as well as asymmetric filling of right Meckel's cave. The pituitary
gland is separate from the mass, and the mass demonstrates
predominantly hypointense T2 weighted signal which is atypical for
chordoma.

Sinuses/Orbits: Unremarkable orbits. Essentially complete
opacification of the sphenoid sinuses with evidence of chronic
sinusitis. Moderate bilateral ethmoid and mild bilateral maxillary
sinus mucosal thickening. Bilateral mastoid and middle ear
effusions. Unremarkable orbits.

Other: None.
IMPRESSION: 7 cm central skull base tumor. Considerations include but are not
limited to meningioma, lymphoma, plasmacytoma, and
nasopharyngeal/sinonasal carcinoma. The appearance is not typical of
chordoma or chondrosarcoma.

These results will be called to the ordering clinician or
representative by the Radiologist Assistant, and communication
documented in the PACS or [REDACTED].

## 2023-09-25 IMAGING — XA DG SPINAL PUNCT LUMBAR DIAG WITH FL CT GUIDANCE
1 series · 1 of 1 positions shown · non-contrast
Comparison: None.

CLINICAL DATA: Chronic headaches and right eye pain. Abducens nerve
palsy. Suspected intracranial hypertension. Large central skull base
tumor on recent MRI

EXAM:
DIAGNOSTIC LUMBAR PUNCTURE UNDER FLUOROSCOPIC GUIDANCE

[Series 1: ortho standard · 1 of 1 slices shown]
[im 1/1]
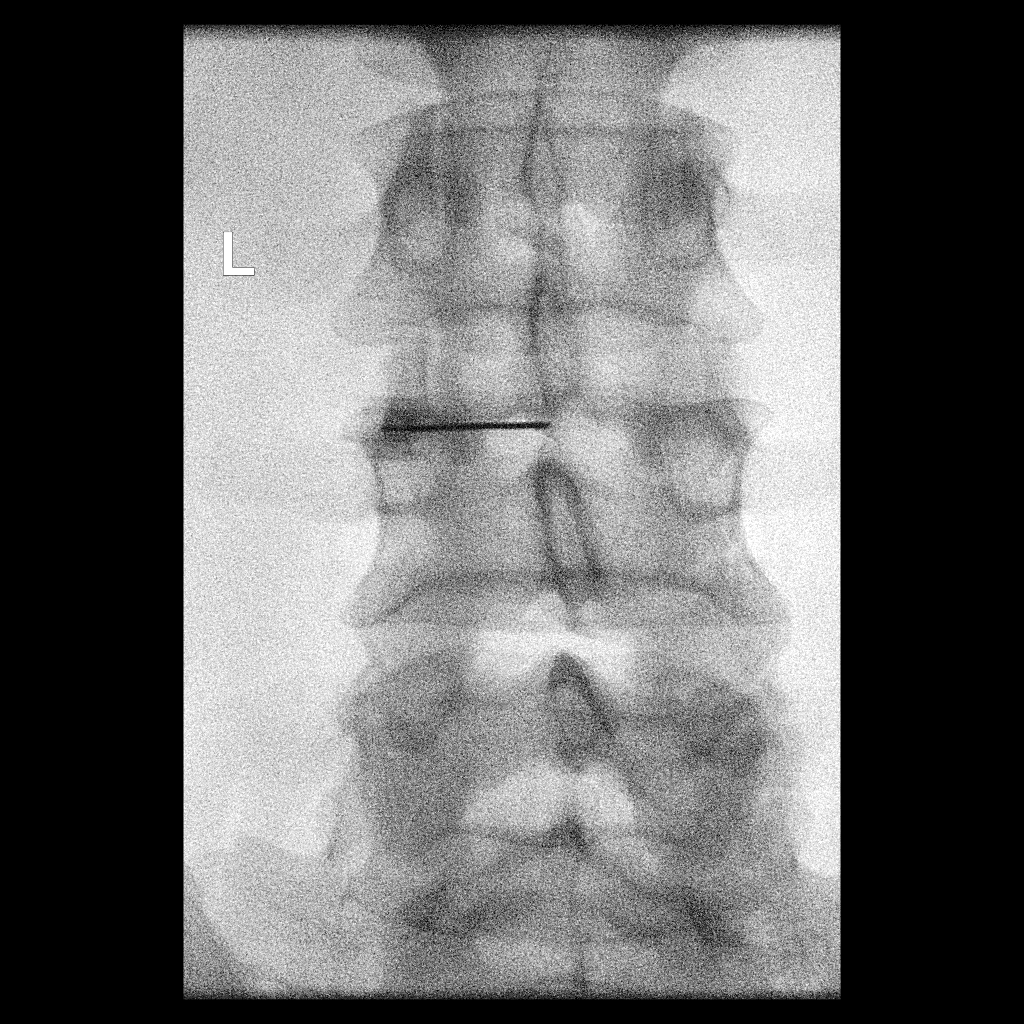

[1 of 1 positions shown; findings below may reference images not displayed]

FLUOROSCOPY TIME:  Fluoroscopy Time:  21 seconds

Radiation Exposure Index (if provided by the fluoroscopic device):
1.6 mGy

Number of Acquired Spot Images: 0

PROCEDURE:
Informed consent was obtained from the patient prior to the
procedure, including potential complications of headache, allergy,
and pain. With the patient prone, the lower back was prepped with
Betadine. 1% Lidocaine was used for local anesthesia. Lumbar
puncture was performed at the L3-L4 level via a left interlaminar
approach using a 3.5 inch 20 gauge needle with return of clear,
colorless CSF with an opening pressure of 48 cm water. 29 ml of CSF
were obtained for laboratory studies. Closing pressure was 20 cm
water. The patient tolerated the procedure well and there were no
apparent complications.
IMPRESSION: 1. Technically successful fluoroscopically guided lumbar puncture.
2. Significantly elevated opening CSF pressure.

## 2023-10-15 ENCOUNTER — Other Ambulatory Visit (HOSPITAL_COMMUNITY): Payer: Self-pay

## 2023-10-22 ENCOUNTER — Other Ambulatory Visit (HOSPITAL_COMMUNITY): Payer: Self-pay

## 2023-10-27 DIAGNOSIS — C119 Malignant neoplasm of nasopharynx, unspecified: Secondary | ICD-10-CM | POA: Diagnosis not present

## 2023-10-27 DIAGNOSIS — D630 Anemia in neoplastic disease: Secondary | ICD-10-CM | POA: Diagnosis not present

## 2023-10-27 DIAGNOSIS — E039 Hypothyroidism, unspecified: Secondary | ICD-10-CM | POA: Diagnosis not present

## 2023-10-27 DIAGNOSIS — R519 Headache, unspecified: Secondary | ICD-10-CM | POA: Diagnosis not present

## 2023-10-27 DIAGNOSIS — Z9221 Personal history of antineoplastic chemotherapy: Secondary | ICD-10-CM | POA: Diagnosis not present

## 2023-10-27 DIAGNOSIS — Z8639 Personal history of other endocrine, nutritional and metabolic disease: Secondary | ICD-10-CM | POA: Diagnosis not present

## 2023-10-27 DIAGNOSIS — Z87891 Personal history of nicotine dependence: Secondary | ICD-10-CM | POA: Diagnosis not present

## 2023-10-27 DIAGNOSIS — Z79899 Other long term (current) drug therapy: Secondary | ICD-10-CM | POA: Diagnosis not present

## 2023-10-27 DIAGNOSIS — K117 Disturbances of salivary secretion: Secondary | ICD-10-CM | POA: Diagnosis not present

## 2023-10-27 DIAGNOSIS — E876 Hypokalemia: Secondary | ICD-10-CM | POA: Diagnosis not present

## 2023-10-27 DIAGNOSIS — C770 Secondary and unspecified malignant neoplasm of lymph nodes of head, face and neck: Secondary | ICD-10-CM | POA: Diagnosis not present

## 2023-10-27 DIAGNOSIS — Z5112 Encounter for antineoplastic immunotherapy: Secondary | ICD-10-CM | POA: Diagnosis not present

## 2023-10-27 DIAGNOSIS — Z95828 Presence of other vascular implants and grafts: Secondary | ICD-10-CM | POA: Diagnosis not present

## 2023-10-27 DIAGNOSIS — Z923 Personal history of irradiation: Secondary | ICD-10-CM | POA: Diagnosis not present

## 2023-10-27 DIAGNOSIS — R634 Abnormal weight loss: Secondary | ICD-10-CM | POA: Diagnosis not present

## 2023-11-17 ENCOUNTER — Other Ambulatory Visit (HOSPITAL_COMMUNITY): Payer: Self-pay

## 2023-11-17 ENCOUNTER — Other Ambulatory Visit: Payer: Self-pay

## 2023-11-17 DIAGNOSIS — J329 Chronic sinusitis, unspecified: Secondary | ICD-10-CM | POA: Diagnosis not present

## 2023-11-17 DIAGNOSIS — Z79899 Other long term (current) drug therapy: Secondary | ICD-10-CM | POA: Diagnosis not present

## 2023-11-17 DIAGNOSIS — E039 Hypothyroidism, unspecified: Secondary | ICD-10-CM | POA: Diagnosis not present

## 2023-11-17 DIAGNOSIS — D630 Anemia in neoplastic disease: Secondary | ICD-10-CM | POA: Diagnosis not present

## 2023-11-17 DIAGNOSIS — G893 Neoplasm related pain (acute) (chronic): Secondary | ICD-10-CM | POA: Diagnosis not present

## 2023-11-17 DIAGNOSIS — E876 Hypokalemia: Secondary | ICD-10-CM | POA: Diagnosis not present

## 2023-11-17 DIAGNOSIS — M25512 Pain in left shoulder: Secondary | ICD-10-CM | POA: Diagnosis not present

## 2023-11-17 DIAGNOSIS — B9689 Other specified bacterial agents as the cause of diseases classified elsewhere: Secondary | ICD-10-CM | POA: Diagnosis not present

## 2023-11-17 DIAGNOSIS — Z5112 Encounter for antineoplastic immunotherapy: Secondary | ICD-10-CM | POA: Diagnosis not present

## 2023-11-17 DIAGNOSIS — J019 Acute sinusitis, unspecified: Secondary | ICD-10-CM | POA: Diagnosis not present

## 2023-11-17 DIAGNOSIS — Z87891 Personal history of nicotine dependence: Secondary | ICD-10-CM | POA: Diagnosis not present

## 2023-11-17 DIAGNOSIS — C770 Secondary and unspecified malignant neoplasm of lymph nodes of head, face and neck: Secondary | ICD-10-CM | POA: Diagnosis not present

## 2023-11-17 DIAGNOSIS — M19012 Primary osteoarthritis, left shoulder: Secondary | ICD-10-CM | POA: Diagnosis not present

## 2023-11-17 DIAGNOSIS — K117 Disturbances of salivary secretion: Secondary | ICD-10-CM | POA: Diagnosis not present

## 2023-11-17 DIAGNOSIS — C119 Malignant neoplasm of nasopharynx, unspecified: Secondary | ICD-10-CM | POA: Diagnosis not present

## 2023-11-17 DIAGNOSIS — Z5181 Encounter for therapeutic drug level monitoring: Secondary | ICD-10-CM | POA: Diagnosis not present

## 2023-11-17 MED ORDER — AMOXICILLIN-POT CLAVULANATE 875-125 MG PO TABS
1.0000 | ORAL_TABLET | Freq: Two times a day (BID) | ORAL | 0 refills | Status: AC
  Filled 2023-11-17: qty 14, 7d supply, fill #0

## 2023-11-18 ENCOUNTER — Other Ambulatory Visit (HOSPITAL_COMMUNITY): Payer: Self-pay

## 2023-11-18 ENCOUNTER — Other Ambulatory Visit: Payer: Self-pay

## 2023-12-02 ENCOUNTER — Other Ambulatory Visit: Payer: Self-pay

## 2023-12-02 ENCOUNTER — Other Ambulatory Visit (HOSPITAL_COMMUNITY): Payer: Self-pay

## 2023-12-04 ENCOUNTER — Encounter (HOSPITAL_COMMUNITY): Payer: Self-pay

## 2023-12-04 ENCOUNTER — Emergency Department (HOSPITAL_COMMUNITY)

## 2023-12-04 ENCOUNTER — Other Ambulatory Visit: Payer: Self-pay

## 2023-12-04 ENCOUNTER — Emergency Department (HOSPITAL_COMMUNITY)
Admission: EM | Admit: 2023-12-04 | Discharge: 2023-12-04 | Disposition: A | Discharge status: Home / Self Care | Attending: Emergency Medicine | Admitting: Emergency Medicine

## 2023-12-04 DIAGNOSIS — Z79899 Other long term (current) drug therapy: Secondary | ICD-10-CM | POA: Diagnosis not present

## 2023-12-04 DIAGNOSIS — I672 Cerebral atherosclerosis: Secondary | ICD-10-CM | POA: Diagnosis not present

## 2023-12-04 DIAGNOSIS — F1290 Cannabis use, unspecified, uncomplicated: Secondary | ICD-10-CM

## 2023-12-04 DIAGNOSIS — E871 Hypo-osmolality and hyponatremia: Secondary | ICD-10-CM | POA: Insufficient documentation

## 2023-12-04 DIAGNOSIS — N179 Acute kidney failure, unspecified: Secondary | ICD-10-CM

## 2023-12-04 DIAGNOSIS — G4489 Other headache syndrome: Secondary | ICD-10-CM | POA: Diagnosis not present

## 2023-12-04 DIAGNOSIS — R55 Syncope and collapse: Secondary | ICD-10-CM | POA: Diagnosis not present

## 2023-12-04 DIAGNOSIS — R29818 Other symptoms and signs involving the nervous system: Secondary | ICD-10-CM | POA: Diagnosis not present

## 2023-12-04 DIAGNOSIS — I10 Essential (primary) hypertension: Secondary | ICD-10-CM | POA: Diagnosis not present

## 2023-12-04 DIAGNOSIS — R778 Other specified abnormalities of plasma proteins: Secondary | ICD-10-CM | POA: Diagnosis not present

## 2023-12-04 DIAGNOSIS — R42 Dizziness and giddiness: Secondary | ICD-10-CM | POA: Diagnosis not present

## 2023-12-04 LAB — URINALYSIS, ROUTINE W REFLEX MICROSCOPIC
Bacteria, UA: NONE SEEN
Bilirubin Urine: NEGATIVE
Glucose, UA: NEGATIVE mg/dL
Ketones, ur: NEGATIVE mg/dL
Leukocytes,Ua: NEGATIVE
Nitrite: NEGATIVE
Protein, ur: NEGATIVE mg/dL
Specific Gravity, Urine: 1.011 (ref 1.005–1.030)
pH: 7 (ref 5.0–8.0)

## 2023-12-04 LAB — RAPID URINE DRUG SCREEN, HOSP PERFORMED
Amphetamines: NOT DETECTED
Barbiturates: NOT DETECTED
Benzodiazepines: NOT DETECTED
Cocaine: NOT DETECTED
Opiates: NOT DETECTED
Tetrahydrocannabinol: POSITIVE — AB

## 2023-12-04 LAB — CBC WITH DIFFERENTIAL/PLATELET
Abs Immature Granulocytes: 0.01 10*3/uL (ref 0.00–0.07)
Basophils Absolute: 0 10*3/uL (ref 0.0–0.1)
Basophils Relative: 0 %
Eosinophils Absolute: 0 10*3/uL (ref 0.0–0.5)
Eosinophils Relative: 0 %
HCT: 30.6 % — ABNORMAL LOW (ref 39.0–52.0)
Hemoglobin: 10.1 g/dL — ABNORMAL LOW (ref 13.0–17.0)
Immature Granulocytes: 0 %
Lymphocytes Relative: 6 %
Lymphs Abs: 0.3 10*3/uL — ABNORMAL LOW (ref 0.7–4.0)
MCH: 32.7 pg (ref 26.0–34.0)
MCHC: 33 g/dL (ref 30.0–36.0)
MCV: 99 fL (ref 80.0–100.0)
Monocytes Absolute: 0.3 10*3/uL (ref 0.1–1.0)
Monocytes Relative: 6 %
Neutro Abs: 3.9 10*3/uL (ref 1.7–7.7)
Neutrophils Relative %: 88 %
Platelets: 218 10*3/uL (ref 150–400)
RBC: 3.09 MIL/uL — ABNORMAL LOW (ref 4.22–5.81)
RDW: 14.7 % (ref 11.5–15.5)
WBC: 4.5 10*3/uL (ref 4.0–10.5)
nRBC: 0 % (ref 0.0–0.2)

## 2023-12-04 LAB — COMPREHENSIVE METABOLIC PANEL WITH GFR
ALT: 15 U/L (ref 0–44)
AST: 19 U/L (ref 15–41)
Albumin: 3.2 g/dL — ABNORMAL LOW (ref 3.5–5.0)
Alkaline Phosphatase: 83 U/L (ref 38–126)
Anion gap: 8 (ref 5–15)
BUN: 17 mg/dL (ref 6–20)
CO2: 24 mmol/L (ref 22–32)
Calcium: 8.7 mg/dL — ABNORMAL LOW (ref 8.9–10.3)
Chloride: 98 mmol/L (ref 98–111)
Creatinine, Ser: 2.04 mg/dL — ABNORMAL HIGH (ref 0.61–1.24)
GFR, Estimated: 39 mL/min — ABNORMAL LOW (ref >60)
Glucose, Bld: 89 mg/dL (ref 70–99)
Potassium: 3.5 mmol/L (ref 3.5–5.1)
Sodium: 130 mmol/L — ABNORMAL LOW (ref 135–145)
Total Bilirubin: 0.7 mg/dL (ref 0.0–1.2)
Total Protein: 7.7 g/dL (ref 6.5–8.1)

## 2023-12-04 LAB — CBG MONITORING, ED: Glucose-Capillary: 106 mg/dL — ABNORMAL HIGH (ref 70–99)

## 2023-12-04 LAB — TROPONIN I (HIGH SENSITIVITY)
Troponin I (High Sensitivity): 2 ng/L (ref <18)
Troponin I (High Sensitivity): 3 ng/L (ref <18)

## 2023-12-04 LAB — MAGNESIUM: Magnesium: 2.1 mg/dL (ref 1.7–2.4)

## 2023-12-04 MED ORDER — GADOBUTROL 1 MMOL/ML IV SOLN
9.0000 mL | Freq: Once | INTRAVENOUS | Status: AC | PRN
  Administered 2023-12-04: 9 mL via INTRAVENOUS

## 2023-12-04 MED ORDER — LACTATED RINGERS IV SOLN: INTRAVENOUS | Status: DC

## 2023-12-04 MED ORDER — LACTATED RINGERS IV BOLUS
1000.0000 mL | Freq: Once | INTRAVENOUS | Status: AC
  Administered 2023-12-04: 1000 mL via INTRAVENOUS

## 2023-12-04 NOTE — ED Triage Notes (Signed)
 EMS reports coming from work, called out for witnessed syncopal episode. Pt did not fall, no head strike or obvious injuries, Pt was assisted to chair by co-workers during event. Pt Hx of CA, and states just got Covid and PNA vaccines yesterday.  BP 102 HR 94 RR 20 Sop02 98 RA CBG 106  20ga LAC NS enroute

## 2023-12-04 NOTE — Discharge Instructions (Signed)
 Have someone stay with you until you feel stable. Do not drive, operate machinery, or play sports until your caregiver says it is okay. Keep all follow-up appointments. Lie down right away if you start feeling like you might faint. Breathe deeply and steadily. Wait until all the symptoms have passed.Drink enough fluids to keep your urine clear or pale yellow. If you are taking blood pressure or heart medicine, get up slowly, taking several minutes to sit and then stand. This can reduce dizziness. SEEK IMMEDIATE MEDICAL CARE IF: You have a severe headache. You have unusual pain in the chest, abdomen, or back. You are bleeding from the mouth or rectum, or you have a black or tarry stool. You have an irregular or very fast heartbeat. You have pain with breathing. You have repeated fainting or seizure-like jerking during an episode. You faint when sitting or lying down. You have confusion. You have difficulty walking. You have severe weakness. You have vision problems. If you fainted, call your local emergency services - do not drive yourself to the hospital.   Please return to the emergency department immediately for any new or concerning symptoms, or if you get worse.

## 2023-12-04 NOTE — ED Provider Notes (Signed)
 [facility] EMERGENCY DEPARTMENT AT [facility] Provider Note   CSN: [id number] Arrival date & time: 12/04/23  1227     History  Chief Complaint  Patient presents with   Near Syncope    EMRAN MOLZAHN is a 53 y.o. male.   Near Syncope  Patient presents for near syncope.  Medical history includes migraines, schizophrenia, HTN, HLD, intracranial tumor.  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  He underwent chemotherapy and radiation.  He is followed by [facility] oncology.  He continues to get pembrolizumab infusions for palliative care of recurrent progressive disease.  Patient, however, states that he was told he is cancer free.  He has developed chronic intermittent abnormal smells and tastes.  He washes dishes at work.  Today, while at work, he had a feeling of generalized weakness and dizziness.  He states that he did fall but denies any suspected injuries.  He has had these intermittent near syncopal episodes since last night.  With them, he does endorse a taste of "cocaine" in his mouth.     Home Medications Prior to Admission medications   Medication Sig Start Date End Date Taking? Authorizing Provider  atorvastatin  (LIPITOR) 10 MG tablet Take 10 mg by mouth daily. 05/30/20   [provider]  atorvastatin  (LIPITOR) 40 MG tablet take 1 tablet daily for cholesterol 06/15/23     benztropine (COGENTIN) 1 MG tablet Take 1 mg by mouth 2 (two) times daily.      [provider]  benztropine mesylate (COGENTIN) 1 MG/ML injection Inject 1 mg into the muscle once.    [provider]  cetirizine  (ZYRTEC  ALLERGY) 10 MG tablet Take 1 tablet (10 mg total) by mouth daily. 06/20/20   Corbin Dess, PA-C  diphenhydrAMINE  (BENADRYL ) 25 MG tablet Take 1 tablet (25 mg total) by mouth every 6 (six) hours. 04/25/21   Dalene Duck, MD   divalproex  (DEPAKOTE  ER) 500 MG 24 hr tablet Take 1 tablet (500 mg total) by mouth at bedtime. 10/07/22   Phebe Brasil, MD  divalproex  (DEPAKOTE  ER) 500 MG 24 hr tablet Take 1 tablet (500 mg total) by mouth at bedtime. 04/26/23   Phebe Brasil, MD  divalproex  (DEPAKOTE  ER) 500 MG 24 hr tablet take 1 tablet daily at bedtime 06/15/23     fluticasone  (FLONASE ) 50 MCG/ACT nasal spray Place 1 spray into both nostrils daily. 06/20/20   Corbin Dess, PA-C  fluticasone  (FLONASE ) 50 MCG/ACT nasal spray Place 1 spray into both nostrils daily as needed. 11/23/22     hydrochlorothiazide  (HYDRODIURIL ) 12.5 MG tablet Take 1 tablet (12.5 mg total) by mouth daily as needed for leg swelling 06/15/23     levothyroxine  (SYNTHROID ) 25 MCG tablet Take 1 tablet (25 mcg total) by mouth once daily Take on an empty stomach with a glass of water at least 30-60 minutes before breakfast. 05/12/23     levothyroxine  (SYNTHROID ) 25 MCG tablet Take 1 tablet (25 mcg total) by mouth once daily. Take on an empty stomach with a glass of water at least 30-60 minutes before breakfast. 06/23/23     levothyroxine  (SYNTHROID ) 25 MCG tablet Take 1 tablet (25 mcg total) by mouth daily. Take on an empty stomach with a glass of water at least 30-60 minutes before breakfast. 08/25/23     loratadine  (CLARITIN ) 10 MG tablet Take 10 mg by mouth daily. 11/14/20  [provider]  loratadine  (CLARITIN ) 10 MG tablet Take 1 tablet (10 mg total) by mouth daily. 06/15/23     losartan  (COZAAR ) 50 MG tablet Take 1 tablet (50 mg total) by mouth daily. 06/15/23     losartan -hydrochlorothiazide  (HYZAAR ) 50-12.5 MG tablet Take by mouth. 11/29/19   [provider]  losartan -hydrochlorothiazide  (HYZAAR ) 50-12.5 MG tablet Take 1 tablet by mouth daily. 11/08/22     magnesium  oxide (MAG-OX) 400 (240 Mg) MG tablet Take 1 tablet (400 mg total) by mouth daily. 01/25/23     magnesium  oxide (MAG-OX) 400 MG tablet Take 1 tablet (400 mg total) by mouth 2 (two)  times daily. 07/08/23     montelukast (SINGULAIR) 10 MG tablet Take 10 mg by mouth daily. 05/30/20   [provider]  naproxen  sodium (ALEVE ) 220 MG tablet Take 1 tablet (220 mg total) by mouth 2 (two) times daily as needed. 04/25/21   Dalene Duck, MD  omeprazole  (PRILOSEC) 20 MG capsule Take 1 capsule (20 mg total) by mouth daily. 08/03/15   Nannette Babe, MD  ondansetron  (ZOFRAN  ODT) 4 MG disintegrating tablet Take 1 tablet (4 mg total) by mouth every 8 (eight) hours as needed. 04/22/21   Phebe Brasil, MD  ondansetron  (ZOFRAN ) 8 MG tablet Take 1 tablet (8 mg total) by mouth every 8 (eight) hours as needed for nausea/vomiting. 01/27/23     Potassium Chloride  ER 20 MEQ TBCR Take 1 tablet (20 mEq total) by mouth 2 (two) times daily. 03/01/23     Potassium Chloride  ER 20 MEQ TBCR Take 1 tablet (20 mEq total) by mouth 2 (two) times daily 04/21/23     Potassium Chloride  ER 20 MEQ TBCR Take 1 tablet (20 mEq total) by mouth 2 (two) times daily. 05/12/23     promethazine -dextromethorphan (PROMETHAZINE -DM) 6.25-15 MG/5ML syrup Take 5 mLs by mouth 4 (four) times daily as needed for cough. 10/02/22   Eloise Hake Scales, PA-C  risperiDONE (RISPERDAL) 0.5 MG tablet Take 0.5 mg by mouth at bedtime. 12/25/20   [provider]  risperiDONE microspheres (RISPERDAL CONSTA) 50 MG injection Inject into the muscle. 11/16/19   [provider]  rizatriptan  (MAXALT -MLT) 10 MG disintegrating tablet Take 1 tablet (10 mg total) by mouth as needed for migraine. May repeat in 2 hours if needed 04/25/21   Dalene Duck, MD  thyroid  (ARMOUR) 30 MG tablet Take 1 tablet (30 mg total) by mouth daily. 06/02/23         Allergies    Patient has no known allergies.    Review of Systems   Review of Systems  Cardiovascular:  Positive for near-syncope.  Neurological:  Positive for dizziness, weakness (Generalized) and light-headedness.  All other systems reviewed and are negative.   Physical  Exam Updated Vital Signs BP (!) 125/96   Pulse 90   Temp 98.9 F (37.2 C) (Oral)   Resp 17   SpO2 98%  Physical Exam Vitals and nursing note reviewed.  Constitutional:      General: He is not in acute distress.    Appearance: Normal appearance. He is well-developed. He is not ill-appearing, toxic-appearing or diaphoretic.  HENT:     Head: Normocephalic and atraumatic.     Right Ear: External ear normal.     Left Ear: External ear normal.     Nose: Nose normal.     Mouth/Throat:     Mouth: Mucous membranes are moist.  Eyes:     Extraocular Movements: Extraocular movements  intact.     Conjunctiva/sclera: Conjunctivae normal.  Cardiovascular:     Rate and Rhythm: Normal rate and regular rhythm.  Pulmonary:     Effort: Pulmonary effort is normal. No respiratory distress.  Abdominal:     General: There is no distension.     Palpations: Abdomen is soft.     Tenderness: There is no abdominal tenderness.  Musculoskeletal:        General: No swelling.     Cervical back: Normal range of motion and neck supple.  Skin:    General: Skin is warm and dry.     Coloration: Skin is not jaundiced or pale.  Neurological:     Mental Status: He is alert and oriented to person, place, and time.     Cranial Nerves: Cranial nerve deficit present.     Sensory: No sensory deficit.     Motor: No weakness.     Coordination: Coordination normal.     Comments: Right-sided abducens nerve palsy is present.  Does have a history of this bilaterally.  Psychiatric:        Mood and Affect: Mood normal.        Behavior: Behavior normal.     ED Results / Procedures / Treatments   Labs (all labs ordered are listed, but only abnormal results are displayed) Labs Reviewed  CBC WITH DIFFERENTIAL/PLATELET - Abnormal; Notable for the following components:      Result Value   RBC 3.09 (*)    Hemoglobin 10.1 (*)    HCT 30.6 (*)    Lymphs Abs 0.3 (*)    All other components within normal limits   COMPREHENSIVE METABOLIC PANEL WITH GFR - Abnormal; Notable for the following components:   Sodium 130 (*)    Creatinine, Ser 2.04 (*)    Calcium  8.7 (*)    Albumin 3.2 (*)    GFR, Estimated 39 (*)    All other components within normal limits  CBG MONITORING, ED - Abnormal; Notable for the following components:   Glucose-Capillary 106 (*)    All other components within normal limits  MAGNESIUM   URINALYSIS, ROUTINE W REFLEX MICROSCOPIC  RAPID URINE DRUG SCREEN, HOSP PERFORMED  TROPONIN I (HIGH SENSITIVITY)  TROPONIN I (HIGH SENSITIVITY)    EKG None  Radiology No results found.  Procedures Procedures    Medications Ordered in ED Medications  lactated ringers infusion (has no administration in time range)  lactated ringers bolus 1,000 mL (1,000 mLs Intravenous New Bag/Given 12/04/23 1309)    ED Course/ Medical Decision Making/ A&P                                 Medical Decision Making Amount and/or Complexity of Data Reviewed Labs: ordered. Radiology: ordered.   This patient presents to the ED for concern of near syncope, this involves an extensive number of treatment options, and is a complaint that carries with it a high risk of complications and morbidity.  The differential diagnosis includes CVA, TIA, worsening neoplasm, dehydration, anemia, metabolic derangements, vasovagal episodes, arrhythmia   Co morbidities that complicate the patient evaluation  migraines, schizophrenia, HTN, HLD, intracranial tumor   Additional history obtained:  Additional history obtained from N/A External records from outside source obtained and reviewed including EMR   Lab Tests:  I Ordered, and personally interpreted labs.  The pertinent results include: AKI is present.  There is mild hyponatremia with otherwise normal  electrolytes.  Hemoglobin is baseline.  No leukocytosis is present.   Imaging Studies ordered:  I ordered imaging studies including CT head, MRI brain I  independently visualized and interpreted imaging which showed (pending at time of signout) I agree with the radiologist interpretation   Cardiac Monitoring: / EKG:  The patient was maintained on a cardiac monitor.  I personally viewed and interpreted the cardiac monitored which showed an underlying rhythm of: Sinus rhythm   Problem List / ED Course / Critical interventions / Medication management  Patient presenting for intermittent near syncopal symptoms since last night.  On arrival in the ED, vital signs are normal.  Patient is overall well-appearing on exam.  He has a history of intracranial tumor and is currently on palliative therapy.  He has a history of bilateral abducens nerve palsy.  He has a right sided 6th nerve palsy on exam today.  He does not appear to have any other focal neurologic deficits.  Workup was initiated. Initial lab work notable for AKI.  Additional IV fluids were ordered.  Patient in normal sinus rhythm on monitor.  Imaging studies pending at time of signout.  Care of patient signed out to oncoming ED provider. I ordered medication including IV fluids for hydration Reevaluation of the patient after these medicines showed that the patient improved I have reviewed the patients home medicines and have made adjustments as needed  Social Determinants of Health:  Has access to outpatient care        Final Clinical Impression(s) / ED Diagnoses Final diagnoses:  Near syncope  AKI (acute kidney injury) [facility])    Rx / DC Orders ED Discharge Orders     None         Iva Mariner, MD 12/04/23 1506

## 2023-12-04 NOTE — ED Provider Notes (Signed)
  Provider Note MRN:  [id number]  Arrival date & time: 12/04/23    ED Course and Medical Decision Making  Assumed care from Dr Kermit Ped at shift change.  See note from prior team for complete details, in brief:  Clinical Course as of 12/04/23 2205  Sat Dec 04, 2023  [address] 53 yo male Near syncope Has AKI today Hx brain cancer, MRI pending [SG]  1755 MRI with known metastatic disease, roughly unchanged from prior MRI [SG]  1841 Tetrahydrocannabinol(!): POSITIVE [SG]  2147 Feeling better, tol po w/o difficulty. Ambulatory w/o re-occurrence of symptoms. Favor likely near syncope 2/2 dehydration. No cp or palpitations. [SG]    Clinical Course User Index [SG] Teddi Favors, DO    He is feeling much better He has dehydration , last Cr was 1.2 on 5/7, today was 2.01. he was given fluids here and has been tolerating PO w/o difficulty. He is feeling much better   Patient presents with syncopal symptoms without worrisome features. Presentation most suggestive of neuro-cardiogenic or orthostatic cause. Very low suspicion for serious arrhythmia, cardiac ischemia or other serious etiology. ECG reviewed, no evidence of a cardiac arrhythmia such as Brugada, WPW, HOCM, IHSS, Long or short QT. Neurologic exam is nonfocal, not consistent with CVA or primary neurologic abnormality. Patient appears safe for discharge with outpatient observation and close PCP F/U. Syncope warnings discussed with patient. The patient has been instructed to return immediately if the symptoms worsen in any way. Patient verbalized understanding and is in agreement with current care plan. All questions answered prior to discharge.    Procedures  Final Clinical Impressions(s) / ED Diagnoses     ICD-10-CM   1. Near syncope  R55     2. AKI (acute kidney injury) (HCC)  N17.9     3. Marijuana use  F12.90       ED Discharge Orders     None         Discharge Instructions      Have someone stay with you  until you feel stable. Do not drive, operate machinery, or play sports until your caregiver says it is okay. Keep all follow-up appointments.  Lie down right away if you start feeling like you might faint. Breathe deeply and steadily. Wait until all the symptoms have passed.Drink enough fluids to keep your urine clear or pale yellow. If you are taking blood pressure or heart medicine, get up slowly, taking several minutes to sit and then stand. This can reduce dizziness. SEEK IMMEDIATE MEDICAL CARE IF: You have a severe headache. You have unusual pain in the chest, abdomen, or back. You are bleeding from the mouth or rectum, or you have a black or tarry stool. You have an irregular or very fast heartbeat. You have pain with breathing. You have repeated fainting or seizure-like jerking during an episode. You faint when sitting or lying down. You have confusion. You have difficulty walking. You have severe weakness. You have vision problems. If you fainted, call your local emergency services - do not drive yourself to the hospital.   Please return to the emergency department immediately for any new or concerning symptoms, or if you get worse.      Teddi Favors, DO 12/04/23 2205

## 2023-12-08 DIAGNOSIS — C770 Secondary and unspecified malignant neoplasm of lymph nodes of head, face and neck: Secondary | ICD-10-CM | POA: Diagnosis not present

## 2023-12-08 DIAGNOSIS — Z87891 Personal history of nicotine dependence: Secondary | ICD-10-CM | POA: Diagnosis not present

## 2023-12-08 DIAGNOSIS — K117 Disturbances of salivary secretion: Secondary | ICD-10-CM | POA: Diagnosis not present

## 2023-12-08 DIAGNOSIS — R55 Syncope and collapse: Secondary | ICD-10-CM | POA: Diagnosis not present

## 2023-12-08 DIAGNOSIS — E860 Dehydration: Secondary | ICD-10-CM | POA: Diagnosis not present

## 2023-12-08 DIAGNOSIS — E039 Hypothyroidism, unspecified: Secondary | ICD-10-CM | POA: Diagnosis not present

## 2023-12-08 DIAGNOSIS — E876 Hypokalemia: Secondary | ICD-10-CM | POA: Diagnosis not present

## 2023-12-08 DIAGNOSIS — C119 Malignant neoplasm of nasopharynx, unspecified: Secondary | ICD-10-CM | POA: Diagnosis not present

## 2023-12-08 DIAGNOSIS — Z923 Personal history of irradiation: Secondary | ICD-10-CM | POA: Diagnosis not present

## 2023-12-08 DIAGNOSIS — R634 Abnormal weight loss: Secondary | ICD-10-CM | POA: Diagnosis not present

## 2023-12-08 DIAGNOSIS — D649 Anemia, unspecified: Secondary | ICD-10-CM | POA: Diagnosis not present

## 2023-12-08 DIAGNOSIS — Z5112 Encounter for antineoplastic immunotherapy: Secondary | ICD-10-CM | POA: Diagnosis not present

## 2023-12-09 DIAGNOSIS — R002 Palpitations: Secondary | ICD-10-CM | POA: Diagnosis not present

## 2023-12-09 DIAGNOSIS — E785 Hyperlipidemia, unspecified: Secondary | ICD-10-CM | POA: Diagnosis not present

## 2023-12-09 DIAGNOSIS — Z1211 Encounter for screening for malignant neoplasm of colon: Secondary | ICD-10-CM | POA: Diagnosis not present

## 2023-12-09 DIAGNOSIS — Z79899 Other long term (current) drug therapy: Secondary | ICD-10-CM | POA: Diagnosis not present

## 2023-12-09 DIAGNOSIS — D630 Anemia in neoplastic disease: Secondary | ICD-10-CM | POA: Diagnosis not present

## 2023-12-09 DIAGNOSIS — E876 Hypokalemia: Secondary | ICD-10-CM | POA: Diagnosis not present

## 2023-12-09 DIAGNOSIS — Z136 Encounter for screening for cardiovascular disorders: Secondary | ICD-10-CM | POA: Diagnosis not present

## 2023-12-09 DIAGNOSIS — Z139 Encounter for screening, unspecified: Secondary | ICD-10-CM | POA: Diagnosis not present

## 2023-12-09 DIAGNOSIS — I10 Essential (primary) hypertension: Secondary | ICD-10-CM | POA: Diagnosis not present

## 2023-12-09 DIAGNOSIS — E559 Vitamin D deficiency, unspecified: Secondary | ICD-10-CM | POA: Diagnosis not present

## 2023-12-09 DIAGNOSIS — Z0001 Encounter for general adult medical examination with abnormal findings: Secondary | ICD-10-CM | POA: Diagnosis not present

## 2023-12-11 DIAGNOSIS — Z1211 Encounter for screening for malignant neoplasm of colon: Secondary | ICD-10-CM | POA: Diagnosis not present

## 2023-12-15 ENCOUNTER — Other Ambulatory Visit (HOSPITAL_COMMUNITY): Payer: Self-pay

## 2023-12-20 ENCOUNTER — Other Ambulatory Visit (HOSPITAL_COMMUNITY): Payer: Self-pay

## 2023-12-29 DIAGNOSIS — Z7969 Long term (current) use of other immunomodulators and immunosuppressants: Secondary | ICD-10-CM | POA: Diagnosis not present

## 2023-12-29 DIAGNOSIS — C770 Secondary and unspecified malignant neoplasm of lymph nodes of head, face and neck: Secondary | ICD-10-CM | POA: Diagnosis not present

## 2023-12-29 DIAGNOSIS — Z87891 Personal history of nicotine dependence: Secondary | ICD-10-CM | POA: Diagnosis not present

## 2023-12-29 DIAGNOSIS — Z5112 Encounter for antineoplastic immunotherapy: Secondary | ICD-10-CM | POA: Diagnosis not present

## 2023-12-29 DIAGNOSIS — M87852 Other osteonecrosis, left femur: Secondary | ICD-10-CM | POA: Diagnosis not present

## 2023-12-29 DIAGNOSIS — Z79899 Other long term (current) drug therapy: Secondary | ICD-10-CM | POA: Diagnosis not present

## 2023-12-29 DIAGNOSIS — C119 Malignant neoplasm of nasopharynx, unspecified: Secondary | ICD-10-CM | POA: Diagnosis not present

## 2023-12-29 DIAGNOSIS — G893 Neoplasm related pain (acute) (chronic): Secondary | ICD-10-CM | POA: Diagnosis not present

## 2023-12-29 DIAGNOSIS — D496 Neoplasm of unspecified behavior of brain: Secondary | ICD-10-CM | POA: Diagnosis not present

## 2023-12-29 DIAGNOSIS — M87851 Other osteonecrosis, right femur: Secondary | ICD-10-CM | POA: Diagnosis not present

## 2024-01-20 ENCOUNTER — Other Ambulatory Visit: Payer: Self-pay

## 2024-01-20 ENCOUNTER — Other Ambulatory Visit (HOSPITAL_COMMUNITY): Payer: Self-pay

## 2024-01-20 DIAGNOSIS — Z87891 Personal history of nicotine dependence: Secondary | ICD-10-CM | POA: Diagnosis not present

## 2024-01-20 DIAGNOSIS — C119 Malignant neoplasm of nasopharynx, unspecified: Secondary | ICD-10-CM | POA: Diagnosis not present

## 2024-01-20 DIAGNOSIS — K117 Disturbances of salivary secretion: Secondary | ICD-10-CM | POA: Diagnosis not present

## 2024-01-20 DIAGNOSIS — D630 Anemia in neoplastic disease: Secondary | ICD-10-CM | POA: Diagnosis not present

## 2024-01-20 DIAGNOSIS — Z5111 Encounter for antineoplastic chemotherapy: Secondary | ICD-10-CM | POA: Diagnosis not present

## 2024-01-20 DIAGNOSIS — E876 Hypokalemia: Secondary | ICD-10-CM | POA: Diagnosis not present

## 2024-01-20 DIAGNOSIS — M25642 Stiffness of left hand, not elsewhere classified: Secondary | ICD-10-CM | POA: Diagnosis not present

## 2024-01-20 DIAGNOSIS — R519 Headache, unspecified: Secondary | ICD-10-CM | POA: Diagnosis not present

## 2024-01-20 DIAGNOSIS — R634 Abnormal weight loss: Secondary | ICD-10-CM | POA: Diagnosis not present

## 2024-01-20 DIAGNOSIS — E039 Hypothyroidism, unspecified: Secondary | ICD-10-CM | POA: Diagnosis not present

## 2024-01-20 DIAGNOSIS — Z6822 Body mass index (BMI) 22.0-22.9, adult: Secondary | ICD-10-CM | POA: Diagnosis not present

## 2024-01-20 DIAGNOSIS — Z5112 Encounter for antineoplastic immunotherapy: Secondary | ICD-10-CM | POA: Diagnosis not present

## 2024-01-20 DIAGNOSIS — G893 Neoplasm related pain (acute) (chronic): Secondary | ICD-10-CM | POA: Diagnosis not present

## 2024-01-20 MED ORDER — DEXAMETHASONE 4 MG PO TABS
8.0000 mg | ORAL_TABLET | Freq: Every morning | ORAL | 1 refills | Status: AC
  Filled 2024-01-20: qty 42, 21d supply, fill #0

## 2024-01-24 ENCOUNTER — Other Ambulatory Visit (HOSPITAL_COMMUNITY): Payer: Self-pay

## 2024-01-26 ENCOUNTER — Other Ambulatory Visit: Payer: Self-pay

## 2024-01-26 ENCOUNTER — Other Ambulatory Visit (HOSPITAL_COMMUNITY): Payer: Self-pay

## 2024-01-26 MED ORDER — MAGNESIUM OXIDE 400 MG PO TABS
400.0000 mg | ORAL_TABLET | Freq: Two times a day (BID) | ORAL | 5 refills | Status: AC
  Filled 2024-01-26: qty 60, 30d supply, fill #0
  Filled 2024-02-29: qty 60, 30d supply, fill #1
  Filled 2024-03-31: qty 60, 30d supply, fill #2
  Filled 2024-05-12: qty 60, 30d supply, fill #3
  Filled 2024-06-13: qty 60, 30d supply, fill #4
  Filled 2024-07-24: qty 60, 30d supply, fill #5

## 2024-01-27 ENCOUNTER — Other Ambulatory Visit (HOSPITAL_COMMUNITY): Payer: Self-pay

## 2024-01-27 DIAGNOSIS — Z5111 Encounter for antineoplastic chemotherapy: Secondary | ICD-10-CM | POA: Diagnosis not present

## 2024-01-27 DIAGNOSIS — K117 Disturbances of salivary secretion: Secondary | ICD-10-CM | POA: Diagnosis not present

## 2024-01-27 DIAGNOSIS — C119 Malignant neoplasm of nasopharynx, unspecified: Secondary | ICD-10-CM | POA: Diagnosis not present

## 2024-01-27 DIAGNOSIS — D649 Anemia, unspecified: Secondary | ICD-10-CM | POA: Diagnosis not present

## 2024-01-27 DIAGNOSIS — E039 Hypothyroidism, unspecified: Secondary | ICD-10-CM | POA: Diagnosis not present

## 2024-01-27 DIAGNOSIS — Z923 Personal history of irradiation: Secondary | ICD-10-CM | POA: Diagnosis not present

## 2024-01-27 DIAGNOSIS — E876 Hypokalemia: Secondary | ICD-10-CM | POA: Diagnosis not present

## 2024-01-27 DIAGNOSIS — M25642 Stiffness of left hand, not elsewhere classified: Secondary | ICD-10-CM | POA: Diagnosis not present

## 2024-01-27 DIAGNOSIS — R519 Headache, unspecified: Secondary | ICD-10-CM | POA: Diagnosis not present

## 2024-01-27 DIAGNOSIS — R110 Nausea: Secondary | ICD-10-CM | POA: Diagnosis not present

## 2024-01-27 DIAGNOSIS — D509 Iron deficiency anemia, unspecified: Secondary | ICD-10-CM | POA: Diagnosis not present

## 2024-01-27 DIAGNOSIS — Z87891 Personal history of nicotine dependence: Secondary | ICD-10-CM | POA: Diagnosis not present

## 2024-01-27 DIAGNOSIS — Z79899 Other long term (current) drug therapy: Secondary | ICD-10-CM | POA: Diagnosis not present

## 2024-01-27 DIAGNOSIS — E538 Deficiency of other specified B group vitamins: Secondary | ICD-10-CM | POA: Diagnosis not present

## 2024-01-27 MED ORDER — FOLIC ACID 1 MG PO TABS
1.0000 mg | ORAL_TABLET | Freq: Every day | ORAL | 3 refills | Status: AC
  Filled 2024-01-27: qty 30, 30d supply, fill #0
  Filled 2024-02-01: qty 90, 90d supply, fill #0
  Filled 2024-05-02: qty 90, 90d supply, fill #1
  Filled 2024-08-07: qty 90, 90d supply, fill #2

## 2024-01-28 ENCOUNTER — Encounter: Payer: Self-pay | Admitting: Advanced Practice Midwife

## 2024-01-28 ENCOUNTER — Other Ambulatory Visit: Payer: Self-pay

## 2024-01-28 ENCOUNTER — Other Ambulatory Visit (HOSPITAL_COMMUNITY): Payer: Self-pay

## 2024-01-28 DIAGNOSIS — C119 Malignant neoplasm of nasopharynx, unspecified: Secondary | ICD-10-CM | POA: Diagnosis not present

## 2024-02-01 ENCOUNTER — Other Ambulatory Visit: Payer: Self-pay

## 2024-02-01 ENCOUNTER — Other Ambulatory Visit (HOSPITAL_COMMUNITY): Payer: Self-pay

## 2024-02-02 ENCOUNTER — Other Ambulatory Visit: Payer: Self-pay

## 2024-02-09 DIAGNOSIS — Z79899 Other long term (current) drug therapy: Secondary | ICD-10-CM | POA: Diagnosis not present

## 2024-02-09 DIAGNOSIS — Z87891 Personal history of nicotine dependence: Secondary | ICD-10-CM | POA: Diagnosis not present

## 2024-02-09 DIAGNOSIS — E876 Hypokalemia: Secondary | ICD-10-CM | POA: Diagnosis not present

## 2024-02-09 DIAGNOSIS — D649 Anemia, unspecified: Secondary | ICD-10-CM | POA: Diagnosis not present

## 2024-02-09 DIAGNOSIS — Z5112 Encounter for antineoplastic immunotherapy: Secondary | ICD-10-CM | POA: Diagnosis not present

## 2024-02-09 DIAGNOSIS — E039 Hypothyroidism, unspecified: Secondary | ICD-10-CM | POA: Diagnosis not present

## 2024-02-09 DIAGNOSIS — Z713 Dietary counseling and surveillance: Secondary | ICD-10-CM | POA: Diagnosis not present

## 2024-02-09 DIAGNOSIS — Z5111 Encounter for antineoplastic chemotherapy: Secondary | ICD-10-CM | POA: Diagnosis not present

## 2024-02-09 DIAGNOSIS — K117 Disturbances of salivary secretion: Secondary | ICD-10-CM | POA: Diagnosis not present

## 2024-02-09 DIAGNOSIS — C119 Malignant neoplasm of nasopharynx, unspecified: Secondary | ICD-10-CM | POA: Diagnosis not present

## 2024-02-09 DIAGNOSIS — R519 Headache, unspecified: Secondary | ICD-10-CM | POA: Diagnosis not present

## 2024-02-09 DIAGNOSIS — E538 Deficiency of other specified B group vitamins: Secondary | ICD-10-CM | POA: Diagnosis not present

## 2024-02-29 ENCOUNTER — Other Ambulatory Visit (HOSPITAL_COMMUNITY): Payer: Self-pay

## 2024-03-01 ENCOUNTER — Other Ambulatory Visit (HOSPITAL_COMMUNITY): Payer: Self-pay

## 2024-03-06 ENCOUNTER — Other Ambulatory Visit: Payer: Self-pay

## 2024-03-06 ENCOUNTER — Other Ambulatory Visit (HOSPITAL_COMMUNITY): Payer: Self-pay

## 2024-03-17 ENCOUNTER — Other Ambulatory Visit (HOSPITAL_COMMUNITY): Payer: Self-pay

## 2024-03-29 ENCOUNTER — Other Ambulatory Visit: Payer: Self-pay

## 2024-03-29 ENCOUNTER — Other Ambulatory Visit (HOSPITAL_COMMUNITY): Payer: Self-pay

## 2024-03-29 MED ORDER — LOPERAMIDE HCL 2 MG PO TABS
4.0000 mg | ORAL_TABLET | ORAL | 1 refills | Status: AC
  Filled 2024-03-29: qty 24, 3d supply, fill #0

## 2024-03-30 ENCOUNTER — Other Ambulatory Visit: Payer: Self-pay

## 2024-03-31 ENCOUNTER — Other Ambulatory Visit (HOSPITAL_COMMUNITY): Payer: Self-pay

## 2024-04-04 ENCOUNTER — Other Ambulatory Visit: Payer: Self-pay

## 2024-05-02 ENCOUNTER — Other Ambulatory Visit (HOSPITAL_COMMUNITY): Payer: Self-pay

## 2024-05-12 ENCOUNTER — Other Ambulatory Visit (HOSPITAL_COMMUNITY): Payer: Self-pay

## 2024-05-16 ENCOUNTER — Telehealth (HOSPITAL_COMMUNITY): Payer: Self-pay | Admitting: *Deleted

## 2024-05-16 ENCOUNTER — Other Ambulatory Visit (HOSPITAL_COMMUNITY): Payer: Self-pay | Admitting: Nurse Practitioner

## 2024-05-16 ENCOUNTER — Encounter (HOSPITAL_COMMUNITY): Payer: Self-pay | Admitting: *Deleted

## 2024-05-16 DIAGNOSIS — R7989 Other specified abnormal findings of blood chemistry: Secondary | ICD-10-CM

## 2024-05-16 DIAGNOSIS — C119 Malignant neoplasm of nasopharynx, unspecified: Secondary | ICD-10-CM

## 2024-05-16 NOTE — Telephone Encounter (Signed)
 Contacted by Rocky at [address] location who stated a Dr from [facility] was reaching out to try to get [name] an appt for IVF this Thursday, Friday and Monday.  They could not accommodate all 3 days and reached out to us  at [facility] to see if we could.  I called the patient and he said he has classes daily during the week, from 10am-3pm, however Thursday class is not until noon.  I was able to schedule him for this Thursday at 8am but given his schedule and our hours of operation we are not able to accommodate his fluids on Friday or Monday.  Erin spoke with the nurse at [facility] and made her aware, and I left a detailed message on [name]'s VM giving him the date, time, address of [place] and the name of our clinic as well as that we are located on the first floor of [facility].  I also let him know in the VM we could only get him scheduled on Thursday and not Friday or Monday due to his schedule and our hours of operation.

## 2024-05-17 ENCOUNTER — Encounter (HOSPITAL_COMMUNITY): Payer: Self-pay | Admitting: Nurse Practitioner

## 2024-05-17 ENCOUNTER — Other Ambulatory Visit (HOSPITAL_COMMUNITY): Payer: Self-pay | Admitting: Nurse Practitioner

## 2024-05-17 ENCOUNTER — Telehealth: Payer: Self-pay

## 2024-05-17 DIAGNOSIS — R7989 Other specified abnormal findings of blood chemistry: Secondary | ICD-10-CM | POA: Insufficient documentation

## 2024-05-17 DIAGNOSIS — C119 Malignant neoplasm of nasopharynx, unspecified: Secondary | ICD-10-CM | POA: Insufficient documentation

## 2024-05-17 NOTE — Telephone Encounter (Signed)
 Spoke with Cait, RN on Tuesday, November 4th at 12:27 pm. Cait relayed orders from Dallas Glendia Birmingham, NP at [facility]. Stated that provider would like patient to receive 1 L NS over two hours on Thursday, Friday, and Monday, beginning this week. This RN provided Cait with the infusion center fax number to send the orders.  Informed Cait that our infusion clinic did not have chair availability for all three days, and connected her with the infusion center inside [facility]. Allean Frieze, RN reached out to patient to schedule him.   Rocky FORBES Sar, RN

## 2024-05-18 ENCOUNTER — Encounter (HOSPITAL_COMMUNITY)
Admission: RE | Admit: 2024-05-18 | Discharge: 2024-05-18 | Disposition: A | Discharge status: Home / Self Care | Source: Ambulatory Visit | Attending: Nurse Practitioner | Admitting: Nurse Practitioner

## 2024-05-18 VITALS — BP 157/109 | HR 70 | Temp 98.2°F | Resp 18 | Wt 181.0 lb

## 2024-05-18 DIAGNOSIS — C119 Malignant neoplasm of nasopharynx, unspecified: Secondary | ICD-10-CM | POA: Diagnosis not present

## 2024-05-18 DIAGNOSIS — R7989 Other specified abnormal findings of blood chemistry: Secondary | ICD-10-CM | POA: Diagnosis present

## 2024-05-18 MED ORDER — HEPARIN SOD (PORK) LOCK FLUSH 100 UNIT/ML IV SOLN
INTRAVENOUS | Status: AC
  Filled 2024-05-18: qty 5

## 2024-05-18 MED ORDER — HEPARIN SOD (PORK) LOCK FLUSH 100 UNIT/ML IV SOLN
500.0000 [IU] | Freq: Once | INTRAVENOUS | Status: AC | PRN
  Administered 2024-05-18: 500 [IU]

## 2024-05-18 MED ORDER — SODIUM CHLORIDE 0.9 % IV BOLUS
1000.0000 mL | Freq: Once | INTRAVENOUS | Status: AC
  Administered 2024-05-18: 1000 mL via INTRAVENOUS

## 2024-05-19 ENCOUNTER — Encounter (HOSPITAL_COMMUNITY)
Admission: RE | Admit: 2024-05-19 | Discharge: 2024-05-19 | Disposition: A | Discharge status: Home / Self Care | Source: Ambulatory Visit | Attending: Nurse Practitioner | Admitting: Nurse Practitioner

## 2024-05-19 VITALS — BP 164/113 | HR 81 | Temp 97.5°F | Resp 17

## 2024-05-19 DIAGNOSIS — R7989 Other specified abnormal findings of blood chemistry: Secondary | ICD-10-CM

## 2024-05-19 DIAGNOSIS — C119 Malignant neoplasm of nasopharynx, unspecified: Secondary | ICD-10-CM

## 2024-05-19 MED ORDER — HEPARIN SOD (PORK) LOCK FLUSH 100 UNIT/ML IV SOLN
500.0000 [IU] | Freq: Once | INTRAVENOUS | Status: AC | PRN
  Administered 2024-05-19: 500 [IU]

## 2024-05-19 MED ORDER — HEPARIN SOD (PORK) LOCK FLUSH 100 UNIT/ML IV SOLN
INTRAVENOUS | Status: AC
Start: 2024-05-19 — End: 2024-05-19
  Filled 2024-05-19: qty 5

## 2024-05-19 MED ORDER — HEPARIN SOD (PORK) LOCK FLUSH 100 UNIT/ML IV SOLN: 250.0000 [IU] | Freq: Once | INTRAVENOUS | Status: DC | PRN

## 2024-05-19 MED ORDER — SODIUM CHLORIDE 0.9 % IV BOLUS
1000.0000 mL | Freq: Once | INTRAVENOUS | Status: AC
  Administered 2024-05-19: 1000 mL via INTRAVENOUS

## 2024-05-19 NOTE — Progress Notes (Signed)
 Patient bp elevated 164/113. Patient encouraged to take BP meds as scheduled. Patient asked to stay for few minutes to recheck but he stated he could not and had to go. Patient stated he was okay.

## 2024-05-22 ENCOUNTER — Encounter (HOSPITAL_COMMUNITY)
Admission: RE | Admit: 2024-05-22 | Discharge: 2024-05-22 | Disposition: A | Discharge status: Home / Self Care | Source: Ambulatory Visit | Attending: Nurse Practitioner | Admitting: Nurse Practitioner

## 2024-05-22 VITALS — BP 166/104 | HR 80 | Temp 98.1°F | Resp 16

## 2024-05-22 DIAGNOSIS — C119 Malignant neoplasm of nasopharynx, unspecified: Secondary | ICD-10-CM

## 2024-05-22 DIAGNOSIS — R7989 Other specified abnormal findings of blood chemistry: Secondary | ICD-10-CM

## 2024-05-22 MED ORDER — HEPARIN SOD (PORK) LOCK FLUSH 100 UNIT/ML IV SOLN
500.0000 [IU] | Freq: Once | INTRAVENOUS | Status: AC | PRN
  Administered 2024-05-22: 500 [IU]

## 2024-05-22 MED ORDER — SODIUM CHLORIDE 0.9 % IV BOLUS
1000.0000 mL | Freq: Once | INTRAVENOUS | Status: AC
  Administered 2024-05-22: 1000 mL via INTRAVENOUS

## 2024-05-22 MED ORDER — HEPARIN SOD (PORK) LOCK FLUSH 100 UNIT/ML IV SOLN
INTRAVENOUS | Status: AC
Start: 2024-05-22 — End: 2024-05-22
  Filled 2024-05-22: qty 5

## 2024-05-29 ENCOUNTER — Other Ambulatory Visit: Payer: Self-pay

## 2024-05-29 ENCOUNTER — Other Ambulatory Visit (HOSPITAL_COMMUNITY): Payer: Self-pay

## 2024-05-30 ENCOUNTER — Other Ambulatory Visit (HOSPITAL_COMMUNITY): Payer: Self-pay | Admitting: Nurse Practitioner

## 2024-06-02 ENCOUNTER — Ambulatory Visit

## 2024-06-02 ENCOUNTER — Other Ambulatory Visit (HOSPITAL_COMMUNITY): Payer: Self-pay

## 2024-06-02 VITALS — BP 170/118 | HR 80 | Temp 98.6°F | Resp 12 | Ht 72.0 in | Wt 181.0 lb

## 2024-06-02 DIAGNOSIS — C119 Malignant neoplasm of nasopharynx, unspecified: Secondary | ICD-10-CM

## 2024-06-02 DIAGNOSIS — R7989 Other specified abnormal findings of blood chemistry: Secondary | ICD-10-CM

## 2024-06-02 MED ORDER — SODIUM CHLORIDE 0.9 % IV BOLUS
1000.0000 mL | Freq: Once | INTRAVENOUS | Status: DC
  Filled 2024-06-02: qty 1000

## 2024-06-02 MED ORDER — SODIUM CHLORIDE 0.9 % IV BOLUS
500.0000 mL | Freq: Once | INTRAVENOUS | Status: DC
  Filled 2024-06-02: qty 500

## 2024-06-02 NOTE — Progress Notes (Signed)
 Patient presented to clinic for fluid infusion today. Initial blood pressure reading per [facility] 163/112 at 0931. Allowed patient to rest for 20 minutes and then took blood pressure reading again. BP 170/118 manually. Called ordering provider's group at [facility] [phone]) and spoke with Burnard, RN at nurse triage line. Relayed blood pressure readings and that patient stated he had taken his Losartan  this morning at approximately 0730 and that he denied any pain, headache, or other symptoms at this time. Relayed to Florence that, per clinic protocol, we would need authorization from the ordering provider to infuse today, due to the elevated blood pressure readings. Burnard, RN verbalized understanding and stated that she would page the ordering provider, Dallas Glendia Birmingham, NP. Patient updated and verbalized understanding.  At 1020, this RN let patient know that no response had been received so far from the ordering provider. Patient stated that he did not want to wait any longer. Patient discharged.   Eduardo FORBES Sar, RN

## 2024-06-05 ENCOUNTER — Other Ambulatory Visit (HOSPITAL_COMMUNITY): Payer: Self-pay | Admitting: Nurse Practitioner

## 2024-06-05 ENCOUNTER — Other Ambulatory Visit: Payer: Self-pay

## 2024-06-05 ENCOUNTER — Ambulatory Visit

## 2024-06-05 ENCOUNTER — Other Ambulatory Visit (HOSPITAL_COMMUNITY): Payer: Self-pay

## 2024-06-05 VITALS — BP 133/88 | HR 84 | Temp 97.5°F | Resp 18 | Ht 72.0 in | Wt 178.0 lb

## 2024-06-05 DIAGNOSIS — C119 Malignant neoplasm of nasopharynx, unspecified: Secondary | ICD-10-CM | POA: Diagnosis not present

## 2024-06-05 DIAGNOSIS — R7989 Other specified abnormal findings of blood chemistry: Secondary | ICD-10-CM

## 2024-06-05 MED ORDER — HEPARIN SOD (PORK) LOCK FLUSH 100 UNIT/ML IV SOLN
500.0000 [IU] | Freq: Once | INTRAVENOUS | Status: AC | PRN
  Administered 2024-06-05: 500 [IU]

## 2024-06-05 MED ORDER — POTASSIUM CHLORIDE ER 20 MEQ PO TBCR
1.0000 | EXTENDED_RELEASE_TABLET | Freq: Two times a day (BID) | ORAL | 3 refills | Status: AC
  Filled 2024-06-05: qty 180, 90d supply, fill #0

## 2024-06-05 MED ORDER — SODIUM CHLORIDE 0.9 % IV BOLUS
500.0000 mL | Freq: Once | INTRAVENOUS | Status: AC
  Administered 2024-06-05: 500 mL via INTRAVENOUS
  Filled 2024-06-05: qty 500

## 2024-06-05 NOTE — Progress Notes (Signed)
 Spoke w/ RN Mallie from [place] and they are ok with giving NS 500 ml instead of 1 L   Sire Poet D. Jazmynn Pho, PharmD

## 2024-06-05 NOTE — Progress Notes (Signed)
 Diagnosis:  Elevated serum creatinine    Provider:  Mannam, Praveen MD  Procedure: IV Infusion  IV Type: Peripheral, IV Location: R Chest  , Normal Saline, Dose: 500 mg  Infusion Start Time: 1243  Infusion Stop Time: 1353  Post Infusion IV Care: Port a Cath Deaccessed/Flushed and Heparin  locked  Discharge: Condition: Good, Destination: Home . AVS Declined  Performed by:  Joyice Magda, RN

## 2024-06-06 ENCOUNTER — Other Ambulatory Visit (HOSPITAL_COMMUNITY): Payer: Self-pay

## 2024-06-06 MED ORDER — POTASSIUM CHLORIDE ER 20 MEQ PO TBCR: 1.0000 | EXTENDED_RELEASE_TABLET | Freq: Two times a day (BID) | ORAL | 3 refills | Status: DC

## 2024-06-07 ENCOUNTER — Other Ambulatory Visit (HOSPITAL_COMMUNITY): Payer: Self-pay

## 2024-06-07 ENCOUNTER — Other Ambulatory Visit: Payer: Self-pay

## 2024-06-07 ENCOUNTER — Telehealth (HOSPITAL_COMMUNITY): Payer: Self-pay | Admitting: Nurse Practitioner

## 2024-06-07 ENCOUNTER — Other Ambulatory Visit (HOSPITAL_COMMUNITY): Payer: Self-pay | Admitting: Nurse Practitioner

## 2024-06-07 NOTE — Telephone Encounter (Signed)
 Patient referred to infusion pharmacy team for ambulatory infusion of Fulphila.  Insurance - [employer] Medicare Site of care - Site of care: [facility] Dx code - C11.96 Therapy - Fulphila order faxed from [facility] to be administered on June 09, 2024. Advised [facility] our infusion center is closed that day and would need to order this medication. [facility] will f/u with the patient for alternative arrangements.  Thank you,  Norton Blush, PharmD, [facility]

## 2024-06-12 ENCOUNTER — Ambulatory Visit (INDEPENDENT_AMBULATORY_CARE_PROVIDER_SITE_OTHER)

## 2024-06-12 VITALS — BP 135/92 | HR 85 | Temp 97.9°F | Resp 18 | Ht 72.0 in | Wt 186.2 lb

## 2024-06-12 DIAGNOSIS — C119 Malignant neoplasm of nasopharynx, unspecified: Secondary | ICD-10-CM

## 2024-06-12 MED ORDER — SODIUM CHLORIDE 0.9 % IV BOLUS (SEPSIS)
1000.0000 mL | Freq: Once | INTRAVENOUS | Status: AC
  Administered 2024-06-12: 1000 mL via INTRAVENOUS
  Filled 2024-06-12: qty 1000

## 2024-06-12 MED ORDER — HEPARIN SOD (PORK) LOCK FLUSH 100 UNIT/ML IV SOLN
500.0000 [IU] | Freq: Once | INTRAVENOUS | Status: AC | PRN
  Administered 2024-06-12: 500 [IU]
  Filled 2024-06-12: qty 5

## 2024-06-12 NOTE — Progress Notes (Signed)
 Diagnosis: Nasopharyngeal carcinoma   Provider:  Theophilus Roosevelt MD  Procedure: IV Infusion  IV Type: Port a Cath, IV Location: R Chest  , Normal Saline, Dose: 1000 ml  Infusion Start Time: 1008  Infusion Stop Time: 1116  Post Infusion IV Care: Port a Cath Deaccessed/Flushed and heparin  locked.  Discharge: Condition: Good, Destination: Home . AVS Declined  Performed by:  Mariam Helbert, RN

## 2024-06-13 ENCOUNTER — Encounter: Payer: Self-pay | Admitting: Nurse Practitioner

## 2024-06-13 ENCOUNTER — Other Ambulatory Visit: Payer: Self-pay

## 2024-06-13 ENCOUNTER — Other Ambulatory Visit (HOSPITAL_COMMUNITY): Payer: Self-pay

## 2024-06-13 MED ORDER — ATORVASTATIN CALCIUM 40 MG PO TABS
40.0000 mg | ORAL_TABLET | Freq: Every day | ORAL | 3 refills | Status: DC
  Filled 2024-06-13: qty 90, 90d supply, fill #0

## 2024-06-13 MED ORDER — LOSARTAN POTASSIUM 100 MG PO TABS
100.0000 mg | ORAL_TABLET | Freq: Every morning | ORAL | 3 refills | Status: DC
  Filled 2024-06-13: qty 90, 90d supply, fill #0

## 2024-06-13 MED ORDER — LOSARTAN POTASSIUM 50 MG PO TABS
50.0000 mg | ORAL_TABLET | Freq: Every day | ORAL | 2 refills | Status: DC
  Filled 2024-06-13: qty 90, 90d supply, fill #0

## 2024-06-14 ENCOUNTER — Other Ambulatory Visit: Payer: Self-pay

## 2024-06-15 ENCOUNTER — Other Ambulatory Visit: Payer: Self-pay

## 2024-06-15 ENCOUNTER — Ambulatory Visit

## 2024-06-15 ENCOUNTER — Inpatient Hospital Stay (HOSPITAL_COMMUNITY)
Admission: EM | Admit: 2024-06-15 | Discharge: 2024-06-20 | DRG: 809 | Disposition: A | Attending: Internal Medicine | Admitting: Internal Medicine

## 2024-06-15 DIAGNOSIS — D61818 Other pancytopenia: Principal | ICD-10-CM | POA: Diagnosis present

## 2024-06-15 LAB — COMPREHENSIVE METABOLIC PANEL WITH GFR
ALT: 11 U/L (ref 0–44)
AST: 14 U/L — ABNORMAL LOW (ref 15–41)
Albumin: 3.1 g/dL — ABNORMAL LOW (ref 3.5–5.0)
Alkaline Phosphatase: 41 U/L (ref 38–126)
Anion gap: 10 (ref 5–15)
BUN: 55 mg/dL — ABNORMAL HIGH (ref 6–20)
CO2: 24 mmol/L (ref 22–32)
Calcium: 7.9 mg/dL — ABNORMAL LOW (ref 8.9–10.3)
Chloride: 97 mmol/L — ABNORMAL LOW (ref 98–111)
Creatinine, Ser: 2.26 mg/dL — ABNORMAL HIGH (ref 0.61–1.24)
GFR, Estimated: 34 mL/min — ABNORMAL LOW (ref >60)
Glucose, Bld: 97 mg/dL (ref 70–99)
Potassium: 4 mmol/L (ref 3.5–5.1)
Sodium: 131 mmol/L — ABNORMAL LOW (ref 135–145)
Total Bilirubin: 0.2 mg/dL (ref 0.0–1.2)
Total Protein: 5.2 g/dL — ABNORMAL LOW (ref 6.5–8.1)

## 2024-06-15 LAB — CBC
HCT: 13.2 % — ABNORMAL LOW (ref 39.0–52.0)
Hemoglobin: 4.6 g/dL — CL (ref 13.0–17.0)
MCH: 33.6 pg (ref 26.0–34.0)
MCHC: 34.8 g/dL (ref 30.0–36.0)
MCV: 96.4 fL (ref 80.0–100.0)
Platelets: <5 K/uL — CL (ref 150–400)
RBC: 1.37 MIL/uL — ABNORMAL LOW (ref 4.22–5.81)
RDW: 15.3 % (ref 11.5–15.5)
WBC: 0.4 K/uL — CL (ref 4.0–10.5)
nRBC: 0 % (ref 0.0–0.2)

## 2024-06-15 LAB — CBG MONITORING, ED: Glucose-Capillary: 107 mg/dL — ABNORMAL HIGH (ref 70–99)

## 2024-06-15 MED ORDER — SODIUM CHLORIDE 0.9 % IV BOLUS
1000.0000 mL | Freq: Once | INTRAVENOUS | Status: AC
  Administered 2024-06-15: 1000 mL via INTRAVENOUS

## 2024-06-15 NOTE — ED Provider Notes (Signed)
 [place] EMERGENCY DEPARTMENT AT [facility] Provider Note  CSN: [id number] Arrival date & time: 06/15/24 2148  Chief Complaint(s) Fall and Epistaxis  HPI [name] is a 53 y.o. male {Add pertinent medical, surgical, social history, OB history to HPI:1}    Fall  Epistaxis   Past Medical History Past Medical History:  Diagnosis Date  . Headache   . Hearing loss   . Hyperlipidemia   . Hypertension   . Schizophrenia, paranoid type [facility])    Patient Active Problem List   Diagnosis Date Noted  . 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 Elevated serum creatinine 05/17/2024  . 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 Nasopharyngeal mass 06/19/2021  . Intracranial tumor (HCC) 06/16/2021  . Injury of abducens nerve 06/16/2021  . Intracranial hypertension 05/14/2021  . Chronic migraine w/o aura w/o status migrainosus, not intractable 04/22/2021  . Polypharmacy 04/22/2021   Home Medication(s) Prior to Admission medications   Medication Sig Start Date End Date Taking? Authorizing Provider  atorvastatin  (LIPITOR) 10 MG tablet Take 10 mg by mouth daily. 05/30/20   [provider]  atorvastatin  (LIPITOR) 40 MG tablet Take 1 tablet (40 mg total) by mouth daily for cholesterol 06/13/24     benztropine  (COGENTIN ) 1 MG tablet Take 1 mg by mouth 2 (two) times daily.      [provider]  benztropine  mesylate (COGENTIN ) 1 MG/ML injection Inject 1 mg into the muscle once.    [provider]  cetirizine  (ZYRTEC  ALLERGY) 10 MG tablet Take 1 tablet (10 mg total) by mouth daily. 06/20/20   Stuart Vernell Norris, PA-C  dexamethasone  (DECADRON ) 4 MG tablet Take 2 tablets (8 mg) by mouth daily in the morning for 3 days following cisplatin  chemotherapy. 01/20/24     diphenhydrAMINE  (BENADRYL ) 25 MG tablet Take 1 tablet (25 mg total) by mouth every 6 (six) hours. 04/25/21   Patt Alm Macho, MD  divalproex  (DEPAKOTE  ER) 500 MG 24 hr tablet  Take 1 tablet (500 mg total) by mouth at bedtime. 10/07/22   Onita Duos, MD  divalproex  (DEPAKOTE  ER) 500 MG 24 hr tablet Take 1 tablet (500 mg total) by mouth at bedtime. 04/26/23   Onita Duos, MD  divalproex  (DEPAKOTE  ER) 500 MG 24 hr tablet take 1 tablet daily at bedtime 06/15/23     fluticasone  (FLONASE ) 50 MCG/ACT nasal spray Place 1 spray into both nostrils daily. 06/20/20   Stuart Vernell Norris, PA-C  fluticasone  (FLONASE ) 50 MCG/ACT nasal spray Place 1 spray into both nostrils daily as needed. 11/23/22     folic acid  (FOLVITE ) 1 MG tablet Take 1 tablet (1 mg total) by mouth daily. 01/27/24     levothyroxine  (SYNTHROID ) 25 MCG tablet Take 1 tablet (25 mcg total) by mouth once daily Take on an empty stomach with a glass of water at least 30-60 minutes before breakfast. 05/12/23     levothyroxine  (SYNTHROID ) 25 MCG tablet Take 1 tablet (25 mcg total) by mouth once daily. Take on an empty stomach with a glass of water at least 30-60 minutes before breakfast. 06/23/23     levothyroxine  (SYNTHROID ) 25 MCG tablet Take 1 tablet (25 mcg total) by mouth daily. Take on an empty stomach with a glass of water at least 30-60 minutes before breakfast. 08/25/23     loperamide  (IMODIUM  A-D) 2 MG tablet Take 2 tablets (4 mg total) by mouth at first diarrhea, then one tablet after each additional diarrhea. No more than 8 tablets  per day. 03/29/24     loratadine  (CLARITIN ) 10 MG tablet Take 10 mg by mouth daily. 11/14/20   [provider]  loratadine  (CLARITIN ) 10 MG tablet Take 1 tablet (10 mg total) by mouth daily. 06/15/23     losartan  (COZAAR ) 100 MG tablet Take 1 tablet (100 mg total) by mouth in the morning  for high blood pressure 06/13/24     losartan  (COZAAR ) 50 MG tablet Take 1 tablet (50 mg total) by mouth daily. 06/13/24     losartan -hydrochlorothiazide  (HYZAAR ) 50-12.5 MG tablet Take by mouth. 11/29/19   [provider]  losartan -hydrochlorothiazide  (HYZAAR ) 50-12.5 MG tablet Take 1 tablet by  mouth daily. 11/08/22     magnesium  oxide (MAG-OX) 400 (240 Mg) MG tablet Take 1 tablet (400 mg total) by mouth daily. 01/25/23     magnesium  oxide (MAG-OX) 400 MG tablet Take 1 tablet (400 mg total) by mouth 2 (two) times daily. 01/25/24     montelukast (SINGULAIR) 10 MG tablet Take 10 mg by mouth daily. 05/30/20   [provider]  naproxen  sodium (ALEVE ) 220 MG tablet Take 1 tablet (220 mg total) by mouth 2 (two) times daily as needed. 04/25/21   Patt Alm Macho, MD  omeprazole  (PRILOSEC) 20 MG capsule Take 1 capsule (20 mg total) by mouth daily. 08/03/15   Baxter Drivers, MD  ondansetron  (ZOFRAN  ODT) 4 MG disintegrating tablet Take 1 tablet (4 mg total) by mouth every 8 (eight) hours as needed. 04/22/21   Onita Duos, MD  ondansetron  (ZOFRAN ) 8 MG tablet Take 1 tablet (8 mg total) by mouth every 8 (eight) hours as needed for nausea/vomiting. 01/27/23     Potassium Chloride  ER 20 MEQ TBCR Take 1 tablet (20 mEq total) by mouth 2 (two) times daily. 03/01/23     Potassium Chloride  ER 20 MEQ TBCR Take 1 tablet (20 mEq total) by mouth 2 (two) times daily 04/21/23     Potassium Chloride  ER 20 MEQ TBCR Take 1 tablet (20 mEq total) by mouth 2 (two) times daily. 06/05/24     Potassium Chloride  ER 20 MEQ TBCR Take 1 tablet (20 mEq total) by mouth 2 (two) times daily 06/06/24     promethazine -dextromethorphan (PROMETHAZINE -DM) 6.25-15 MG/5ML syrup Take 5 mLs by mouth 4 (four) times daily as needed for cough. 10/02/22   Joesph Shaver Scales, PA-C  risperiDONE  (RISPERDAL ) 0.5 MG tablet Take 0.5 mg by mouth at bedtime. 12/25/20   [provider]  risperiDONE  microspheres (RISPERDAL  CONSTA) 50 MG injection Inject into the muscle. 11/16/19   [provider]  rizatriptan  (MAXALT -MLT) 10 MG disintegrating tablet Take 1 tablet (10 mg total) by mouth as needed for migraine. May repeat in 2 hours if needed 04/25/21   Patt Alm Macho, MD  thyroid  (ARMOUR) 30 MG tablet Take 1 tablet (30 mg total) by  mouth daily. 06/02/23     hydrochlorothiazide  (HYDRODIURIL ) 12.5 MG tablet Take 1 tablet (12.5 mg total) by mouth daily as needed for leg swelling 06/15/23 12/09/23  Allergies Patient has no known allergies.  Review of Systems Review of Systems  HENT:  Positive for nosebleeds.    As noted in HPI  Physical Exam Vital Signs  I have reviewed the triage vital signs BP 124/87   Pulse (!) 110   Temp 98.2 F (36.8 C) (Oral)   Resp 14   SpO2 96%  *** Physical Exam  ED Results and Treatments Labs (all labs ordered are listed, but only abnormal results are displayed) Labs Reviewed  COMPREHENSIVE METABOLIC PANEL WITH GFR  CBC  URINALYSIS, ROUTINE W REFLEX MICROSCOPIC  CBG MONITORING, ED                                                                                                                         EKG  EKG Interpretation Date/Time:    Ventricular Rate:    PR Interval:    QRS Duration:    QT Interval:    QTC Calculation:   R Axis:      Text Interpretation:         Radiology No results found.  Medications Ordered in ED Medications - No data to display Procedures Procedures  (including critical care time) Medical Decision Making / ED Course   Medical Decision Making Amount and/or Complexity of Data Reviewed Labs: ordered.    ***    Final Clinical Impression(s) / ED Diagnoses Final diagnoses:  None    This chart was dictated using voice recognition software.  Despite best efforts to proofread,  errors can occur which can change the documentation meaning.

## 2024-06-15 NOTE — ED Notes (Signed)
 Pt mother called and requested update. Pt mother advised to keep her updated; phone on file correct.

## 2024-06-15 NOTE — ED Notes (Signed)
 Date and time results received: 06/15/24 11:40 PM  (use smartphrase .now to insert current time)  Test: WBC Critical Value: 0.4  Test: Hemoglobin Critical Value: 4.6  Test: Platelet count Critical Value: <5  Name of Provider Notified: [facility]  Orders Received? Or Actions Taken?: Orders Received - See Orders for details

## 2024-06-15 NOTE — ED Triage Notes (Signed)
 Pt came in from home via EMS w/ c/o of a nosebleed since last night and dizziness. No bleeding at this time. Denies being on thinners. Has had a lot of falls recently due to dizziness. Denies hitting head, or LOC. Denies being in pain. Currently in tx for nasopharyngeal cancer at [facility].

## 2024-06-16 ENCOUNTER — Emergency Department (HOSPITAL_COMMUNITY)

## 2024-06-16 ENCOUNTER — Encounter (HOSPITAL_COMMUNITY): Payer: Self-pay | Admitting: Internal Medicine

## 2024-06-16 ENCOUNTER — Encounter: Payer: Self-pay | Admitting: Nurse Practitioner

## 2024-06-16 ENCOUNTER — Other Ambulatory Visit (HOSPITAL_COMMUNITY): Payer: Self-pay

## 2024-06-16 DIAGNOSIS — D709 Neutropenia, unspecified: Secondary | ICD-10-CM | POA: Diagnosis not present

## 2024-06-16 DIAGNOSIS — E039 Hypothyroidism, unspecified: Secondary | ICD-10-CM | POA: Diagnosis present

## 2024-06-16 DIAGNOSIS — T8092XA Unspecified transfusion reaction, initial encounter: Secondary | ICD-10-CM | POA: Diagnosis not present

## 2024-06-16 DIAGNOSIS — D62 Acute posthemorrhagic anemia: Secondary | ICD-10-CM | POA: Diagnosis present

## 2024-06-16 DIAGNOSIS — F200 Paranoid schizophrenia: Secondary | ICD-10-CM | POA: Diagnosis present

## 2024-06-16 DIAGNOSIS — Z87891 Personal history of nicotine dependence: Secondary | ICD-10-CM | POA: Diagnosis not present

## 2024-06-16 DIAGNOSIS — W19XXXA Unspecified fall, initial encounter: Secondary | ICD-10-CM | POA: Diagnosis present

## 2024-06-16 DIAGNOSIS — E871 Hypo-osmolality and hyponatremia: Secondary | ICD-10-CM | POA: Diagnosis not present

## 2024-06-16 DIAGNOSIS — D61818 Other pancytopenia: Principal | ICD-10-CM | POA: Diagnosis present

## 2024-06-16 DIAGNOSIS — N1831 Chronic kidney disease, stage 3a: Secondary | ICD-10-CM | POA: Diagnosis present

## 2024-06-16 DIAGNOSIS — E876 Hypokalemia: Secondary | ICD-10-CM | POA: Diagnosis not present

## 2024-06-16 DIAGNOSIS — H02401 Unspecified ptosis of right eyelid: Secondary | ICD-10-CM | POA: Diagnosis present

## 2024-06-16 DIAGNOSIS — E785 Hyperlipidemia, unspecified: Secondary | ICD-10-CM | POA: Diagnosis present

## 2024-06-16 DIAGNOSIS — C119 Malignant neoplasm of nasopharynx, unspecified: Secondary | ICD-10-CM | POA: Diagnosis present

## 2024-06-16 DIAGNOSIS — H9190 Unspecified hearing loss, unspecified ear: Secondary | ICD-10-CM | POA: Diagnosis present

## 2024-06-16 DIAGNOSIS — J342 Deviated nasal septum: Secondary | ICD-10-CM | POA: Diagnosis present

## 2024-06-16 DIAGNOSIS — I129 Hypertensive chronic kidney disease with stage 1 through stage 4 chronic kidney disease, or unspecified chronic kidney disease: Secondary | ICD-10-CM | POA: Diagnosis present

## 2024-06-16 DIAGNOSIS — R5081 Fever presenting with conditions classified elsewhere: Secondary | ICD-10-CM | POA: Diagnosis present

## 2024-06-16 DIAGNOSIS — R0902 Hypoxemia: Secondary | ICD-10-CM | POA: Diagnosis not present

## 2024-06-16 DIAGNOSIS — R040 Epistaxis: Secondary | ICD-10-CM | POA: Diagnosis present

## 2024-06-16 DIAGNOSIS — D701 Agranulocytosis secondary to cancer chemotherapy: Secondary | ICD-10-CM | POA: Diagnosis present

## 2024-06-16 DIAGNOSIS — N179 Acute kidney failure, unspecified: Secondary | ICD-10-CM | POA: Diagnosis present

## 2024-06-16 DIAGNOSIS — Z79890 Hormone replacement therapy (postmenopausal): Secondary | ICD-10-CM | POA: Diagnosis not present

## 2024-06-16 DIAGNOSIS — R112 Nausea with vomiting, unspecified: Secondary | ICD-10-CM | POA: Diagnosis present

## 2024-06-16 DIAGNOSIS — D61810 Antineoplastic chemotherapy induced pancytopenia: Secondary | ICD-10-CM | POA: Diagnosis present

## 2024-06-16 DIAGNOSIS — R296 Repeated falls: Secondary | ICD-10-CM | POA: Diagnosis present

## 2024-06-16 LAB — MRSA NEXT GEN BY PCR, NASAL: MRSA by PCR Next Gen: NOT DETECTED

## 2024-06-16 LAB — URINALYSIS, COMPLETE (UACMP) WITH MICROSCOPIC
Bilirubin Urine: NEGATIVE
Glucose, UA: NEGATIVE mg/dL
Ketones, ur: NEGATIVE mg/dL
Leukocytes,Ua: NEGATIVE
Nitrite: NEGATIVE
Protein, ur: NEGATIVE mg/dL
Specific Gravity, Urine: 1.02 (ref 1.005–1.030)
pH: 5 (ref 5.0–8.0)

## 2024-06-16 LAB — CBC WITH DIFFERENTIAL/PLATELET
Abs Immature Granulocytes: 0.01 K/uL (ref 0.00–0.07)
Basophils Absolute: 0 K/uL (ref 0.0–0.1)
Basophils Relative: 0 %
Eosinophils Absolute: 0 K/uL (ref 0.0–0.5)
Eosinophils Relative: 0 %
HCT: 15 % — ABNORMAL LOW (ref 39.0–52.0)
Hemoglobin: 5.3 g/dL — CL (ref 13.0–17.0)
Immature Granulocytes: 8 %
Lymphocytes Relative: 42 %
Lymphs Abs: 0.1 K/uL — ABNORMAL LOW (ref 0.7–4.0)
MCH: 33.5 pg (ref 26.0–34.0)
MCHC: 35.3 g/dL (ref 30.0–36.0)
MCV: 94.9 fL (ref 80.0–100.0)
Monocytes Absolute: 0 K/uL — ABNORMAL LOW (ref 0.1–1.0)
Monocytes Relative: 0 %
Neutro Abs: 0.1 K/uL — CL (ref 1.7–7.7)
Neutrophils Relative %: 50 %
Platelets: 12 K/uL — CL (ref 150–400)
RBC: 1.58 MIL/uL — ABNORMAL LOW (ref 4.22–5.81)
RDW: 14.9 % (ref 11.5–15.5)
Smear Review: NORMAL
WBC: 0.1 K/uL — CL (ref 4.0–10.5)
nRBC: 0 % (ref 0.0–0.2)

## 2024-06-16 LAB — CBC
HCT: 15.6 % — ABNORMAL LOW (ref 39.0–52.0)
Hemoglobin: 5.5 g/dL — CL (ref 13.0–17.0)
MCH: 31.8 pg (ref 26.0–34.0)
MCHC: 35.3 g/dL (ref 30.0–36.0)
MCV: 90.2 fL (ref 80.0–100.0)
Platelets: 27 K/uL — CL (ref 150–400)
RBC: 1.73 MIL/uL — ABNORMAL LOW (ref 4.22–5.81)
RDW: 15.9 % — ABNORMAL HIGH (ref 11.5–15.5)
WBC: 0.1 K/uL — CL (ref 4.0–10.5)
nRBC: 0 % (ref 0.0–0.2)

## 2024-06-16 LAB — I-STAT CG4 LACTIC ACID, ED: Lactic Acid, Venous: 1.2 mmol/L (ref 0.5–1.9)

## 2024-06-16 LAB — PREPARE RBC (CROSSMATCH)

## 2024-06-16 LAB — PROTIME-INR
INR: 1.2 (ref 0.8–1.2)
Prothrombin Time: 15.9 s — ABNORMAL HIGH (ref 11.4–15.2)

## 2024-06-16 LAB — ABO/RH: ABO/RH(D): A POS

## 2024-06-16 MED ORDER — ONDANSETRON HCL 4 MG PO TABS: 4.0000 mg | ORAL_TABLET | Freq: Four times a day (QID) | ORAL | Status: DC | PRN

## 2024-06-16 MED ORDER — FENTANYL CITRATE (PF) 50 MCG/ML IJ SOSY
12.5000 ug | PREFILLED_SYRINGE | INTRAMUSCULAR | Status: DC | PRN
  Administered 2024-06-19: 50 ug via INTRAVENOUS
  Filled 2024-06-16: qty 1

## 2024-06-16 MED ORDER — ALBUTEROL SULFATE (2.5 MG/3ML) 0.083% IN NEBU: 2.5000 mg | INHALATION_SOLUTION | RESPIRATORY_TRACT | Status: DC | PRN

## 2024-06-16 MED ORDER — SODIUM CHLORIDE 0.9% FLUSH
10.0000 mL | Freq: Two times a day (BID) | INTRAVENOUS | Status: DC
  Administered 2024-06-16 – 2024-06-20 (×9): 10 mL

## 2024-06-16 MED ORDER — CHLORHEXIDINE GLUCONATE CLOTH 2 % EX PADS: 6.0000 | MEDICATED_PAD | Freq: Every day | CUTANEOUS | Status: DC

## 2024-06-16 MED ORDER — OXYCODONE HCL 5 MG PO TABS
5.0000 mg | ORAL_TABLET | ORAL | Status: DC | PRN
  Administered 2024-06-19 (×2): 5 mg via ORAL
  Filled 2024-06-16 (×2): qty 1

## 2024-06-16 MED ORDER — ACETAMINOPHEN 650 MG RE SUPP
650.0000 mg | Freq: Four times a day (QID) | RECTAL | Status: DC | PRN
  Administered 2024-06-16: 650 mg via RECTAL
  Filled 2024-06-16: qty 1

## 2024-06-16 MED ORDER — SODIUM CHLORIDE 0.9 % IV SOLN
2.0000 g | Freq: Two times a day (BID) | INTRAVENOUS | Status: DC
  Administered 2024-06-16 – 2024-06-19 (×8): 2 g via INTRAVENOUS
  Filled 2024-06-16 (×8): qty 12.5

## 2024-06-16 MED ORDER — DIVALPROEX SODIUM ER 500 MG PO TB24
500.0000 mg | ORAL_TABLET | Freq: Every day | ORAL | 3 refills | Status: AC
  Filled 2024-06-16: qty 90, 90d supply, fill #0

## 2024-06-16 MED ORDER — ONDANSETRON HCL 4 MG/2ML IJ SOLN: 4.0000 mg | Freq: Four times a day (QID) | INTRAMUSCULAR | Status: DC | PRN

## 2024-06-16 MED ORDER — ATORVASTATIN CALCIUM 40 MG PO TABS
40.0000 mg | ORAL_TABLET | Freq: Every day | ORAL | Status: DC
  Administered 2024-06-16 – 2024-06-18 (×3): 40 mg via ORAL
  Filled 2024-06-16 (×3): qty 1

## 2024-06-16 MED ORDER — ACETAMINOPHEN 325 MG PO TABS
650.0000 mg | ORAL_TABLET | Freq: Four times a day (QID) | ORAL | Status: DC | PRN
  Administered 2024-06-16 – 2024-06-17 (×2): 650 mg via ORAL
  Filled 2024-06-16 (×2): qty 2

## 2024-06-16 MED ORDER — SODIUM CHLORIDE 0.9% IV SOLUTION: Freq: Once | INTRAVENOUS | Status: AC

## 2024-06-16 MED ORDER — LORATADINE 10 MG PO TABS
10.0000 mg | ORAL_TABLET | Freq: Every day | ORAL | 3 refills | Status: AC
  Filled 2024-06-16: qty 90, 90d supply, fill #0

## 2024-06-16 MED ORDER — DIPHENHYDRAMINE HCL 25 MG PO CAPS
50.0000 mg | ORAL_CAPSULE | Freq: Once | ORAL | Status: AC
  Administered 2024-06-16: 50 mg via ORAL
  Filled 2024-06-16: qty 2

## 2024-06-16 MED ORDER — VANCOMYCIN HCL 1750 MG/350ML IV SOLN
1750.0000 mg | Freq: Once | INTRAVENOUS | Status: AC
  Administered 2024-06-16: 1750 mg via INTRAVENOUS
  Filled 2024-06-16: qty 350

## 2024-06-16 MED ORDER — SODIUM CHLORIDE 0.9% FLUSH: 10.0000 mL | INTRAVENOUS | Status: DC | PRN

## 2024-06-16 MED ORDER — CHLORHEXIDINE GLUCONATE CLOTH 2 % EX PADS
6.0000 | MEDICATED_PAD | Freq: Every day | CUTANEOUS | Status: DC
  Administered 2024-06-16 – 2024-06-19 (×4): 6 via TOPICAL

## 2024-06-16 MED ORDER — IOHEXOL 300 MG/ML  SOLN
50.0000 mL | Freq: Once | INTRAMUSCULAR | Status: AC | PRN
  Administered 2024-06-16: 50 mL via INTRAVENOUS

## 2024-06-16 MED ORDER — ACETAMINOPHEN 325 MG PO TABS
650.0000 mg | ORAL_TABLET | Freq: Once | ORAL | Status: AC
  Administered 2024-06-16: 650 mg via ORAL
  Filled 2024-06-16: qty 2

## 2024-06-16 NOTE — Progress Notes (Signed)
 Overnight cross coverage  Informed by RN regarding critical labs: Hemoglobin 5.5 and platelet count 27k on repeat labs.  Patient is no longer having active epistaxis at this time.  Afebrile and blood pressure stable.  2 units PRBCs ordered.  Follow-up posttransfusion CBC.

## 2024-06-16 NOTE — ED Notes (Signed)
 Pt noted to be febrile of 102.1 -124-resp 24 02 sat 83% with good pleth. Pt congested sounding while lying flat, stopped plt infusion, notified Dr Ula to evaluate pt, chest x-ray ordered, blood cultures and additional labs, Lab notified due to stopping PLT. Pt given Tylenol  for fever, placed on 100% NRB, HR decreasing to 115, pt able to go back on 2L  and maintain sats 100%, Dr Zella in to assess pt.

## 2024-06-16 NOTE — H&P (Signed)
 History and Physical  [name] FMW:[id number] DOB: 1970-12-04 DOA: 06/15/2024  PCP: Patient, No Pcp Per   Chief Complaint: Nosebleed  HPI: [name] is a 53 y.o. 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  Patient states he has never had a significant nosebleed in the past, he started bleeding through his nose last night, became very weak and dizzy, had 1 episode of nausea and vomiting.  Multiple times when he tried to get up at home, he passed out and fell to the ground, not sure if he hit his head.  Workup in the emergency department shows severe pancytopenia, CT of the head and neck without acute findings.  Patient was transfused 1 unit of blood overnight, and platelet transfusion was started this morning.  However he had a fever and became hypoxic, so platelet transfusion has been held.  ER provider attempted to transfer patient to [facility], but they were unable to accept due to capacity.  Review of Systems: Please see HPI for pertinent positives and negatives. A complete 10 system review of systems are otherwise negative.  Past Medical History:  Diagnosis Date   Headache    Hearing loss    Hyperlipidemia    Hypertension    Schizophrenia, paranoid type (HCC)    No past surgical history on file. Social History:  reports that he quit smoking about 14 months ago. His smoking use included cigarettes. He has never used smokeless tobacco. He reports current alcohol use. He reports that he does not use drugs.  No Known Allergies  Family History  Problem Relation Age of Onset   Healthy Mother    Diabetes Father    Stroke Maternal Uncle      Prior to Admission medications   Medication Sig Start Date End Date Taking? Authorizing Provider  atorvastatin  (LIPITOR) 40 MG  tablet Take 1 tablet (40 mg total) by mouth daily for cholesterol 06/13/24     benztropine (COGENTIN) 1 MG tablet Take 1 mg by mouth 2 (two) times daily.      [provider]  benztropine mesylate (COGENTIN) 1 MG/ML injection Inject 1 mg into the muscle once.    [provider]  cetirizine  (ZYRTEC  ALLERGY) 10 MG tablet Take 1 tablet (10 mg total) by mouth daily. 06/20/20   Stuart Vernell Norris, PA-C  dexamethasone  (DECADRON ) 4 MG tablet Take 2 tablets (8 mg) by mouth daily in the morning for 3 days following cisplatin  chemotherapy. 01/20/24     diphenhydrAMINE  (BENADRYL ) 25 MG tablet Take 1 tablet (25 mg total) by mouth every 6 (six) hours. 04/25/21   Patt Alm Macho, MD  divalproex  (DEPAKOTE  ER) 500 MG 24 hr tablet Take 1 tablet (500 mg total) by mouth at bedtime. 10/07/22   Onita Duos, MD  divalproex  (DEPAKOTE  ER) 500 MG 24 hr tablet Take 1 tablet (500 mg total) by mouth at bedtime. 04/26/23   Onita Duos, MD  divalproex  (DEPAKOTE  ER) 500 MG 24 hr tablet take 1 tablet daily at bedtime 06/15/23     fluticasone  (FLONASE ) 50 MCG/ACT nasal spray Place 1 spray into both nostrils daily. 06/20/20   Stuart Vernell Norris, PA-C  fluticasone  (FLONASE ) 50 MCG/ACT nasal spray Place 1 spray into both nostrils daily as needed. 11/23/22     folic acid  (FOLVITE ) 1 MG tablet Take 1 tablet (1 mg total) by mouth daily.  01/27/24     levothyroxine  (SYNTHROID ) 25 MCG tablet Take 1 tablet (25 mcg total) by mouth daily. Take on an empty stomach with a glass of water at least 30-60 minutes before breakfast. 08/25/23     loperamide  (IMODIUM  A-D) 2 MG tablet Take 2 tablets (4 mg total) by mouth at first diarrhea, then one tablet after each additional diarrhea. No more than 8 tablets per day. 03/29/24     loratadine  (CLARITIN ) 10 MG tablet Take 10 mg by mouth daily. 11/14/20   [provider]  loratadine  (CLARITIN ) 10 MG tablet Take 1 tablet (10 mg total) by mouth daily. 06/15/23     losartan  (COZAAR ) 100  MG tablet Take 1 tablet (100 mg total) by mouth in the morning  for high blood pressure 06/13/24     losartan  (COZAAR ) 50 MG tablet Take 1 tablet (50 mg total) by mouth daily. 06/13/24     losartan -hydrochlorothiazide  (HYZAAR ) 50-12.5 MG tablet Take by mouth. 11/29/19   [provider]  losartan -hydrochlorothiazide  (HYZAAR ) 50-12.5 MG tablet Take 1 tablet by mouth daily. 11/08/22     magnesium  oxide (MAG-OX) 400 (240 Mg) MG tablet Take 1 tablet (400 mg total) by mouth daily. 01/25/23     magnesium  oxide (MAG-OX) 400 MG tablet Take 1 tablet (400 mg total) by mouth 2 (two) times daily. 01/25/24     montelukast (SINGULAIR) 10 MG tablet Take 10 mg by mouth daily. 05/30/20   [provider]  naproxen  sodium (ALEVE ) 220 MG tablet Take 1 tablet (220 mg total) by mouth 2 (two) times daily as needed. 04/25/21   Patt Alm Macho, MD  omeprazole  (PRILOSEC) 20 MG capsule Take 1 capsule (20 mg total) by mouth daily. 08/03/15   Baxter Drivers, MD  ondansetron  (ZOFRAN  ODT) 4 MG disintegrating tablet Take 1 tablet (4 mg total) by mouth every 8 (eight) hours as needed. 04/22/21   Onita Duos, MD  ondansetron  (ZOFRAN ) 8 MG tablet Take 1 tablet (8 mg total) by mouth every 8 (eight) hours as needed for nausea/vomiting. 01/27/23     Potassium Chloride  ER 20 MEQ TBCR Take 1 tablet (20 mEq total) by mouth 2 (two) times daily. 03/01/23     Potassium Chloride  ER 20 MEQ TBCR Take 1 tablet (20 mEq total) by mouth 2 (two) times daily 04/21/23     Potassium Chloride  ER 20 MEQ TBCR Take 1 tablet (20 mEq total) by mouth 2 (two) times daily. 06/05/24     Potassium Chloride  ER 20 MEQ TBCR Take 1 tablet (20 mEq total) by mouth 2 (two) times daily 06/06/24     promethazine -dextromethorphan (PROMETHAZINE -DM) 6.25-15 MG/5ML syrup Take 5 mLs by mouth 4 (four) times daily as needed for cough. 10/02/22   Joesph Shaver Scales, PA-C  risperiDONE (RISPERDAL) 0.5 MG tablet Take 0.5 mg by mouth at bedtime. 12/25/20   [provider]  risperiDONE microspheres (RISPERDAL CONSTA) 50 MG injection Inject into the muscle. 11/16/19   [provider]  rizatriptan  (MAXALT -MLT) 10 MG disintegrating tablet Take 1 tablet (10 mg total) by mouth as needed for migraine. May repeat in 2 hours if needed 04/25/21   Patt Alm Macho, MD  thyroid  (ARMOUR) 30 MG tablet Take 1 tablet (30 mg total) by mouth daily. 06/02/23     hydrochlorothiazide  (HYDRODIURIL ) 12.5 MG tablet Take 1 tablet (12.5 mg total) by mouth daily as needed for leg swelling 06/15/23 12/09/23      Physical Exam: BP 123/88   Pulse (!) 114  Temp (!) 102.1 F (38.9 C) (Oral)   Resp 15   SpO2 100%  General:  Alert, oriented, calm, in no acute distress, wearing nasal cannula.  He has some dried blood in the naris, but no active bleeding, no bright red blood, oropharynx is also clear without evidence of recent bleeding.  Patient appears to be well-developed and well-nourished. Cardiovascular: RRR, no murmurs or rubs, no peripheral edema  Respiratory: clear to auscultation bilaterally, no wheezes, no crackles  Abdomen: soft, nontender, nondistended, normal bowel tones heard  Skin: dry, no rashes  Musculoskeletal: no joint effusions, normal range of motion  Psychiatric: appropriate affect, normal speech  Neurologic: extraocular muscles intact, clear speech, moving all extremities with intact sensorium         Labs on Admission:  Basic Metabolic Panel: Recent Labs  Lab 06/15/24 2320  NA 131*  K 4.0  CL 97*  CO2 24  GLUCOSE 97  BUN 55*  CREATININE 2.26*  CALCIUM  7.9*   Liver Function Tests: Recent Labs  Lab 06/15/24 2320  AST 14*  ALT 11  ALKPHOS 41  BILITOT 0.2  PROT 5.2*  ALBUMIN 3.1*   No results for input(s): LIPASE, AMYLASE in the last 168 hours. No results for input(s): AMMONIA in the last 168 hours. CBC: Recent Labs  Lab 06/15/24 2320  WBC 0.4*  HGB 4.6*  HCT 13.2*  MCV 96.4  PLT <5*   Cardiac Enzymes: No  results for input(s): CKTOTAL, CKMB, CKMBINDEX, TROPONINI in the last 168 hours. BNP (last 3 results) No results for input(s): BNP in the last 8760 hours.  ProBNP (last 3 results) No results for input(s): PROBNP in the last 8760 hours.  CBG: Recent Labs  Lab 06/15/24 2320  GLUCAP 107*    Radiological Exams on Admission: CT Head W or Wo Contrast Result Date: 06/16/2024 EXAM: CT HEAD WITHOUT AND WITH 06/16/2024 12:46:58 AM TECHNIQUE: CT of the head was performed without and with the administration of 50 mL of iohexol  (OMNIPAQUE ) 300 MG/ML solution. Automated exposure control, iterative reconstruction, and/or weight based adjustment of the mA/kV was utilized to reduce the radiation dose to as low as reasonably achievable. COMPARISON: None available. CLINICAL HISTORY: Brain metastases suspected. FINDINGS: BRAIN AND VENTRICLES: No acute intracranial hemorrhage. No mass effect or midline shift. No extra-axial fluid collection. No evidence of acute infarct. No hydrocephalus. There is extensive bony destruction of the clivus with the right cavernous sinus and sella. ORBITS: Orbits are unremarkable. SINUSES AND MASTOIDS: Extensive mucosal thickening and fluid opacification of the visualized paranasal sinuses. Fluid opacification of the left middle ear cavity and mastoid air cells. Right middle ear cavity and mastoid air cells are clear. SOFT TISSUES AND SKULL: No acute skull fracture. No acute soft tissue abnormality. IMPRESSION: 1. Extensive bony destruction of the clivus with involvement of the right cavernous sinus and sella. 2. Extensive mucosal thickening and fluid opacification of the visualized paranasal sinuses. 3. Fluid opacification of the left middle ear cavity and mastoid air cells. Electronically signed by: Dorethia Molt MD 06/16/2024 01:19 AM EST RP Workstation: [id number]   CT Maxillofacial W Contrast Result Date: 06/16/2024 EXAM: CT Face With contrast 06/16/2024 12:46:58 AM  TECHNIQUE: CT of the face was performed with the administration of intravenous contrast. 50 mL iohexol  (OMNIPAQUE ) 300 MG/ML solution was administered. Multiplanar reformatted images are provided for review. Automated exposure control, iterative reconstruction, and/or weight based adjustment of the mA/kV was utilized to reduce the radiation dose to as low as reasonably achievable. COMPARISON:  None available CLINICAL HISTORY: epistaxis; h/o nasopharyngeal cancer FINDINGS: AERODIGESTIVE TRACT: Frothy secretions are seen within the nasopharynx and posteriorly within the oropharynx and hypopharynx also representing blood products. Extensive soft tissue is seen anterior to the clivus corresponding to no malignancy, however, this is not well delineated in the absence of contrast administration. No mass. No edema. SALIVARY GLANDS: No acute abnormality. LYMPH NODES: No suspicious cervical lymphadenopathy. SOFT TISSUES: Extensive soft tissue is seen anterior to the clivus corresponding to no malignancy, however, this is not well delineated in the absence of contrast administration. No mass or fluid collection. BRAIN, ORBITS AND SINUSES: Visualized intracranial contents are unremarkable. Orbits are unremarkable. Extensive permeative destruction of the clivus related to the patient's known nasopharyngeal carcinoma. Erosive changes involve the posterior walls of the sphenoid sinuses bilaterally and possibly involves the right cavernous sinus (48/13, 63/9). There is extensive mucosal thickening throughout the paranasal sinuses. High attenuation fluid is seen within the left maxillary sinus, likely representing blood products. The left medial pterygoid plate is eroded. Fluid opacification of the left mastoid air cells and middle ear cavity. BONES: No acute facial fracture identified. No mandibular dislocation. Extensive permeative destruction of the clivus related to the patient's known nasopharyngeal carcinoma. Erosive changes  involve the posterior walls of the sphenoid sinuses bilaterally and possibly involves the right cavernous sinus (48/13, 63/9). The left medial pterygoid plate is eroded. IMPRESSION: 1. Extensive permeative destruction of the clivus related to nasopharyngeal carcinoma, with associated soft tissue anterior to the clivus (not well delineated without contrast), erosive changes involving the posterior walls of the sphenoid sinuses bilaterally with possible right cavernous sinus involvement, and erosion of the left medial pterygoid plate. 2. High attenuation fluid in the left maxillary sinus likely representing blood products. 3. Fluid opacification of the left middle ear and mastoid air cells. Electronically signed by: Dorethia Molt MD 06/16/2024 01:07 AM EST RP Workstation: [id number]   CT Cervical Spine Wo Contrast Result Date: 06/16/2024 EXAM: CT CERVICAL SPINE WITHOUT CONTRAST 06/16/2024 12:46:58 AM TECHNIQUE: CT of the cervical spine was performed without the administration of intravenous contrast. Multiplanar reformatted images are provided for review. Automated exposure control, iterative reconstruction, and/or weight based adjustment of the mA/kV was utilized to reduce the radiation dose to as low as reasonably achievable. COMPARISON: None available. CLINICAL HISTORY: recurring falls; h/o schizo ->inconsistent story FINDINGS: CERVICAL SPINE: BONES AND ALIGNMENT: There is extensive permeative destruction of the clivus itself. Craniocervical alignment is normal. No acute fracture of the cervical spine. Vertebral body height is preserved. DEGENERATIVE CHANGES: Endplate changes at C5-C7 is present in keeping with changes of minimal degenerative disc disease. No high-grade canal stenosis. No high-grade neural foraminal narrowing. SOFT TISSUES: Preclival soft tissue thickening at the base of the skull, incompletely included on this examination. IMPRESSION: 1. Extensive permeative destruction of the clivus with  associated preclival soft tissue thickening, incompletely included on this examination. This corresponds to the patient's known nasopharyngeal carcinoma that is seen on MRI examination of 12/04/2023 but is not well assessed on this noncontrast examination. 2. No acute fracture of the cervical spine. Electronically signed by: Dorethia Molt MD 06/16/2024 12:57 AM EST RP Workstation: [id number]   Assessment/Plan Sandrea RAMAN Demorest is a 53 y.o. 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   Epistaxis with acute symptomatic blood loss anemia-undoubtedly due to his nasopharyngeal carcinoma, though he has  never had bleeding before.  Likely due to severe thrombocytopenia.  No evidence of active bleeding currently. -Inpatient admission -Monitor closely on stepdown unit -Transfuse 1 unit PRBC overnight, repeat CBC is pending -Transfuse to keep hemoglobin greater than 7 -In case of significant recurrent epistaxis, would require urgent IR evaluation  Neutropenic fever-patient developed hypoxia and fever when about halfway through his platelet transfusion this morning.  May have been a transfusion reaction, this will be followed up per blood bank protocol.  He has no localizing symptoms, but could easily have aspirated blood. -Blood cultures -Empiric IV cefepime  -Check chest x-ray  AKI superimposed on CKD stage IIIa-baseline creatinine approximately 1.6, elevated today likely due to severe anemia and hypotension -Avoid nephrotoxins, and renally dose medications -Maintain normotension, and treat anemia as above  Thrombocytopenia-platelets less than 5, in the setting of epistaxis.  Received about a half a pack of platelets, but this was paused due to fever and hypoxia which may have been a transfusion  reaction. -Nursing staff is contacted blood bank, workup per protocol -Will resume platelet transfusion when able  Pancytopenia-due to chemotherapy  Hypertension-hold home antihypertensives for the time being  Hyperlipidemia-continue home statin  Hypothyroidism-continue Synthroid  and Armour Thyroid   Schizophrenia, paranoid type-continue Cogentin, Depakote , Risperdal  DVT prophylaxis: SCDs only    Code Status: Full Code  Consults called: None  Admission status: The appropriate patient status for this patient is INPATIENT. Inpatient status is judged to be reasonable and necessary in order to provide the required intensity of service to ensure the patient's safety. The patient's presenting symptoms, physical exam findings, and initial radiographic and laboratory data in the context of their chronic comorbidities is felt to place them at high risk for further clinical deterioration. Furthermore, it is not anticipated that the patient will be medically stable for discharge from the hospital within 2 midnights of admission.    I certify that at the point of admission it is my clinical judgment that the patient will require inpatient hospital care spanning beyond 2 midnights from the point of admission due to high intensity of service, high risk for further deterioration and high frequency of surveillance required  Time spent: 65 minutes  Aviannah Castoro CHRISTELLA Gail MD [facility] Pager [phone]  If 7PM-7AM, please contact night-coverage [email or web address] Password [email or web address]  06/16/2024, 8:47 AM

## 2024-06-16 NOTE — Plan of Care (Signed)
 The patient is a 53 yr old male with PMH of nasopharyngeal cancer being treated at [facility].  Patient is completing chemotherapy and now going through immuno therapy. The patient last received Gemcitabine  on 06/06/24. The patient presents to the hospital this morning with complaint of nasal bleeding which has been persistent. Labs were  obtained which demonstrated white blood cell count of 0.4, hemoglobin/hematocrit 4.6/13.2 and platelets of less than 5.  BMP demonstrated sodium of 131, along with BUN/creatinine 55/2.26 . CT cervical spine was performed which demonstrated extensive permeative destruction of the clivus with associated preclival soft tissue thickening. It was noted to correspond to patient's known nasopharyngeal carcinoma that is seen on MRI 12/04/2023.  Extensive mucosal thickening and fluid opacification visualized in paranasal sinuses.  Fluid opacification of the left middle ear cavity and mastoid air cells.  Demonstrated on CT head without. Patient was given a unit of platelets and a unit of blood.

## 2024-06-16 NOTE — ED Notes (Signed)
 Blood bank has blood ready for this patient.  Notified Dara,EMT-P and she acknowledged.

## 2024-06-16 NOTE — ED Notes (Signed)
 Writer went to check on pt and pt advised he felt fine, but pt's oxygen saturation was in the 70s and 80s. Writer also noted pt having a fever of 102.7. Morning ER doctor notified due to doctor on chart already being gone and no admitting doctor assigned. Pt was placed on non re breather, which slowly brought his oxygen saturation up. Platelets transfusion stopped and lab notified.

## 2024-06-16 NOTE — ED Notes (Signed)
 Per charge platelets stopped.

## 2024-06-16 NOTE — ED Provider Notes (Signed)
 I was asked to evaluate the patient after he started the platelet transfusion this morning while waiting admission to the hospitalist service.  The patient is now febrile roughly 1 hour after starting the platelet transfusion.  His pulse ox is in the mid 80s.  We are still waiting on the differential but the patient is pancytopenic.  Will obtain cultures and start the patient on broad-spectrum antibiotics in the event that he is neutropenic.  We will stop the platelet transfusion and call the blood bank.  Will obtain an x-ray here to evaluate for infiltrates.  The patient was on oxygen when he arrived and looks like he has not had an x-ray at this point.  Unclear at this time if the patient is septic or if this is due to a transfusion reaction.  He will require blood products as he is pancytopenic but will hold them at this time.  His blood pressures are stable.  The patient was evaluated by the hospitalist service who is taking over care.  CRITICAL CARE Performed by: Prentice JONELLE Medicus   Total critical care time: 30 minutes  Critical care time was exclusive of separately billable procedures and treating other patients.  Critical care was necessary to treat or prevent imminent or life-threatening deterioration.  Critical care was time spent personally by me on the following activities: development of treatment plan with patient and/or surrogate as well as nursing, discussions with consultants, evaluation of patient's response to treatment, examination of patient, obtaining history from patient or surrogate, ordering and performing treatments and interventions, ordering and review of laboratory studies, ordering and review of radiographic studies, pulse oximetry and re-evaluation of patient's condition.    Medicus Prentice JONELLE, MD 06/16/24 [phone]

## 2024-06-16 NOTE — Progress Notes (Signed)
 PHARMACY NOTE:  ANTIMICROBIAL RENAL DOSAGE ADJUSTMENT  Current antimicrobial regimen includes a mismatch between antimicrobial dosage and estimated renal function.  As per policy approved by the Pharmacy & Therapeutics and Medical Executive Committees, the antimicrobial dosage will be adjusted accordingly.  Current antimicrobial dosage:  cefepime  2 gm q8 hrs  Indication: febrile neutropenia  Renal Function:  Estimated Creatinine Clearance: 42.4 mL/min (A) (by C-G formula based on SCr of 2.26 mg/dL (H)).      Antimicrobial dosage has been changed to:  cefepime  2 gm IV q12 hrs   Thank you for allowing pharmacy to be a part of this patient's care.  Rosaline IVAR Edison, Pharm.D Use secure chat for questions 06/16/2024 8:32 AM

## 2024-06-16 NOTE — Progress Notes (Signed)
 ED Pharmacy Antibiotic Sign Off An antibiotic consult was received from an ED provider for vanc/cefepime  per pharmacy dosing for FN. A chart review was completed to assess appropriateness.   The following one time order(s) were placed:  Vanc 1750mg   Cefepime  2g  Further antibiotic and/or antibiotic pharmacy consults should be ordered by the admitting provider if indicated.   Thank you for allowing pharmacy to be a part of this patient's care.   Britta Eva Na, [facility]  Clinical Pharmacist 06/16/24 8:30 AM

## 2024-06-16 NOTE — ED Notes (Signed)
 Given for fever

## 2024-06-16 NOTE — ED Notes (Signed)
 Per Dr. Zella platelets restarted and writer is monitoring pt closely. Per Dr. Zella pt on non re breather to aid in oxygenation. Writer also rechecking pt's temperature frequently.

## 2024-06-16 NOTE — ED Notes (Signed)
 Writer requested pt be moved to a recess room and his acuity changed to level 1 and a nurse take over.

## 2024-06-16 NOTE — Plan of Care (Signed)

## 2024-06-17 ENCOUNTER — Other Ambulatory Visit (HOSPITAL_COMMUNITY): Payer: Self-pay

## 2024-06-17 DIAGNOSIS — D61818 Other pancytopenia: Secondary | ICD-10-CM | POA: Diagnosis not present

## 2024-06-17 LAB — CBC
HCT: 16.6 % — ABNORMAL LOW (ref 39.0–52.0)
Hemoglobin: 6.1 g/dL — CL (ref 13.0–17.0)
MCH: 34.5 pg — ABNORMAL HIGH (ref 26.0–34.0)
MCHC: 36.7 g/dL — ABNORMAL HIGH (ref 30.0–36.0)
MCV: 93.8 fL (ref 80.0–100.0)
Platelets: 24 K/uL — CL (ref 150–400)
RBC: 1.77 MIL/uL — ABNORMAL LOW (ref 4.22–5.81)
RDW: 16.3 % — ABNORMAL HIGH (ref 11.5–15.5)
WBC: 0.1 K/uL — CL (ref 4.0–10.5)
nRBC: 0 % (ref 0.0–0.2)

## 2024-06-17 LAB — URINALYSIS, COMPLETE (UACMP) WITH MICROSCOPIC
Bilirubin Urine: NEGATIVE
Glucose, UA: NEGATIVE mg/dL
Ketones, ur: NEGATIVE mg/dL
Leukocytes,Ua: NEGATIVE
Nitrite: NEGATIVE
Protein, ur: NEGATIVE mg/dL
Specific Gravity, Urine: 1.015 (ref 1.005–1.030)
pH: 6 (ref 5.0–8.0)

## 2024-06-17 LAB — BASIC METABOLIC PANEL WITH GFR
Anion gap: 9 (ref 5–15)
BUN: 53 mg/dL — ABNORMAL HIGH (ref 6–20)
CO2: 24 mmol/L (ref 22–32)
Calcium: 8.4 mg/dL — ABNORMAL LOW (ref 8.9–10.3)
Chloride: 103 mmol/L (ref 98–111)
Creatinine, Ser: 1.87 mg/dL — ABNORMAL HIGH (ref 0.61–1.24)
GFR, Estimated: 43 mL/min — ABNORMAL LOW (ref >60)
Glucose, Bld: 94 mg/dL (ref 70–99)
Potassium: 3.3 mmol/L — ABNORMAL LOW (ref 3.5–5.1)
Sodium: 136 mmol/L (ref 135–145)

## 2024-06-17 LAB — HEMOGLOBIN AND HEMATOCRIT, BLOOD
HCT: 18.6 % — ABNORMAL LOW (ref 39.0–52.0)
Hemoglobin: 6.6 g/dL — CL (ref 13.0–17.0)

## 2024-06-17 LAB — PREPARE RBC (CROSSMATCH)

## 2024-06-17 LAB — HIV ANTIBODY (ROUTINE TESTING W REFLEX): HIV Screen 4th Generation wRfx: NONREACTIVE

## 2024-06-17 LAB — DIFFERENTIAL

## 2024-06-17 MED ORDER — LACTATED RINGERS IV SOLN: INTRAVENOUS | Status: AC

## 2024-06-17 MED ORDER — RISPERIDONE 1 MG PO TABS
1.0000 mg | ORAL_TABLET | Freq: Every day | ORAL | Status: DC
  Administered 2024-06-17 – 2024-06-19 (×3): 1 mg via ORAL
  Filled 2024-06-17 (×3): qty 1

## 2024-06-17 MED ORDER — DIPHENHYDRAMINE HCL 50 MG/ML IJ SOLN
25.0000 mg | Freq: Once | INTRAMUSCULAR | Status: AC
  Administered 2024-06-17: 25 mg via INTRAVENOUS
  Filled 2024-06-17: qty 1

## 2024-06-17 MED ORDER — ACETAMINOPHEN 325 MG PO TABS
650.0000 mg | ORAL_TABLET | Freq: Once | ORAL | Status: AC
  Administered 2024-06-17: 650 mg via ORAL
  Filled 2024-06-17: qty 2

## 2024-06-17 MED ORDER — POTASSIUM CHLORIDE CRYS ER 20 MEQ PO TBCR
40.0000 meq | EXTENDED_RELEASE_TABLET | Freq: Once | ORAL | Status: AC
  Administered 2024-06-17: 40 meq via ORAL
  Filled 2024-06-17: qty 2

## 2024-06-17 MED ORDER — SODIUM CHLORIDE 0.9% IV SOLUTION: Freq: Once | INTRAVENOUS | Status: AC

## 2024-06-17 MED ORDER — DIVALPROEX SODIUM ER 250 MG PO TB24
500.0000 mg | ORAL_TABLET | Freq: Every day | ORAL | Status: DC
  Administered 2024-06-17 – 2024-06-19 (×3): 500 mg via ORAL
  Filled 2024-06-17 (×3): qty 2

## 2024-06-17 MED ORDER — LEVOTHYROXINE SODIUM 25 MCG PO TABS
25.0000 ug | ORAL_TABLET | Freq: Every day | ORAL | Status: DC
  Administered 2024-06-17 – 2024-06-20 (×4): 25 ug via ORAL
  Filled 2024-06-17 (×4): qty 1

## 2024-06-17 MED ORDER — METHYLPREDNISOLONE SODIUM SUCC 125 MG IJ SOLR
80.0000 mg | Freq: Once | INTRAMUSCULAR | Status: AC
  Administered 2024-06-17: 80 mg via INTRAVENOUS
  Filled 2024-06-17: qty 2

## 2024-06-17 MED ORDER — BENZTROPINE MESYLATE 0.5 MG PO TABS
1.0000 mg | ORAL_TABLET | Freq: Two times a day (BID) | ORAL | Status: DC
  Administered 2024-06-17 – 2024-06-20 (×7): 1 mg via ORAL
  Filled 2024-06-17 (×7): qty 2

## 2024-06-17 MED ORDER — SODIUM CHLORIDE 0.9% IV SOLUTION: Freq: Once | INTRAVENOUS | Status: DC

## 2024-06-17 MED ORDER — OXYMETAZOLINE HCL 0.05 % NA SOLN
1.0000 | Freq: Two times a day (BID) | NASAL | Status: AC
  Administered 2024-06-17 – 2024-06-19 (×6): 1 via NASAL
  Filled 2024-06-17: qty 15

## 2024-06-17 MED ORDER — FILGRASTIM-AAFI 480 MCG/0.8ML IJ SOSY
480.0000 ug | PREFILLED_SYRINGE | Freq: Every day | INTRAMUSCULAR | Status: DC
  Administered 2024-06-17 – 2024-06-19 (×3): 480 ug via SUBCUTANEOUS
  Filled 2024-06-17 (×3): qty 0.8

## 2024-06-17 MED ORDER — METHYLPREDNISOLONE SODIUM SUCC 40 MG IJ SOLR
40.0000 mg | Freq: Once | INTRAMUSCULAR | Status: AC
  Administered 2024-06-17: 40 mg via INTRAVENOUS
  Filled 2024-06-17: qty 1

## 2024-06-17 MED ORDER — PANTOPRAZOLE SODIUM 40 MG PO TBEC
40.0000 mg | DELAYED_RELEASE_TABLET | Freq: Every day | ORAL | Status: DC
  Administered 2024-06-17 – 2024-06-20 (×4): 40 mg via ORAL
  Filled 2024-06-17 (×4): qty 1

## 2024-06-17 NOTE — Consult Note (Signed)
 [name] is a very nice 53 year old African-American male.  He currently is being treated at [facility].  He has recurrent nasopharyngeal carcinoma.  Initially was diagnosed back in March 2023.  He had a large mass centered about the sphenoid sinus.  He was treated with induction chemotherapy with carboplatinum/gemcitabine .  He then underwent concurrent radiation therapy with weekly cisplatin  him.  He still had residual disease.  He was treated with carboplatinum/Taxol/Keytruda .  A follow-up MRI in December 2023 showed a destructive lesion in the neck.  This was improved.  He was placed on maintenance Keytruda .  In March 2024, his disease progressed.  He was placed back on carbo/Taxol/Keytruda .  He continued this.  Everything seem to be relatively stable.  In June 2025, again, look like he began to have progressive disease.  He was then treated with cisplatin /gemcitabine /toripalimab.  Today, has had 6 cycles.  He last received this on 06/07/2027.  He was subsequently admitted on 06/15/2024 with severe pancytopenia.  When he came in, his white cell count was 400.  Hemoglobin 4.6.  Platelet count less than 5000.  He had some epistaxis.  He was given some blood and platelets.  He apparently had a transfusion reaction.  He is now being premedicated.  His white cell count has dropped quite a bit.  His white cell count today is 100.  His hemoglobin is 6.1.  Platelet count 24,000.  He was started on Neupogen .  When he came in his electrolytes showed a sodium 131.  Potassium 4.0.  BUN 55 creatinine 2.26.  Calcium  7.9 with an albumin of 3.1.  Today, his sodium is 136.  Potassium 3.3.  BUN 53 creatinine 1.87.  He had cultures taken and these were all negative.  He currently is on Maxipime  and vancomycin .  He has had no diarrhea.  He has had no vomiting.  He has had no mouth sores.  He was evaluated by ENT today.  Currently, I will say that his performance status is probably ECOG 1.    Vital signs show  temperature of 98.2.  Pulse 93.  Blood pressure 132/74.  His head and neck exam shows no ocular or oral lesions.  He has a little bit of ptosis over the right eye.  He has no mucositis.  There is no obvious adenopathy in the neck.  Lungs are clear bilaterally.  Cardiac exam regular rate and rhythm.  He has no murmurs.  Abdomen is soft.  Bowel sounds are present.  He has no fluid wave.  There is no palpable liver or spleen tip.  Extremity shows no clubbing, cyanosis or edema.  He has decent range of motion of his joints.  He has decent strength in his muscles.  Neurological exam shows no focal neurological deficits.  Again, he has a little bit of ptosis of the right eye.   [name] is a very nice 53 year old African-American male.  He clearly has a aggressive malignancy.  Typically, nasopharyngeal carcinoma was are highly curable.  Unfortunately, his seems to be in the minority.  I really hate this for him.  He seems very nice.  He has severe pancytopenia.  I suspect this probably from his chemotherapy.  He has had quite a bit of chemotherapy.  He is on Neupogen .  Hopefully this will be able to get his blood count up a little bit more quickly.  I would just make sure his blood counts were followed daily.  Hopefully, he will not have any infection.  His  cultures are all negative.  He is on fairly broad-spectrum antibiotics.  I do not know if he is going to be transferred to [facility] or not.  As long as he is in the hospital at [facility], we will more than happy to follow along and help out with managing his blood counts.   I note that he will get incredible care from everybody in the ICU.  The staff are so compassionate and are so professional.   Jeralyn Crease, MD  Ila 7:14

## 2024-06-17 NOTE — Progress Notes (Addendum)
 Informed by RN that patient is now having a fever with temperature 101.8 F after receiving 1/2 bag of blood but stable otherwise and asymptomatic.  Advised to hold blood transfusion and contact blood bank to check for transfusion reaction.  Give Tylenol  for fever.

## 2024-06-17 NOTE — Consult Note (Addendum)
 Reason for Consult: Epistaxis Referring Physician: Hospitalist  [name] is an 53 y.o. male.  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  He started bleeding from the nose the night prior to admission, more from the left side.  He became very week and dizzy and passed out trying to get up.  He was brought to the ER where he was found to have severe pancytopenia.  He was transfused blood and platelets but had fever and hypoxia thereafter.  Bleeding has settled.  A transfer to [facility] is pending.  Past Medical History:  Diagnosis Date   Headache    Hearing loss    Hyperlipidemia    Hypertension    Schizophrenia, paranoid type (HCC)     History reviewed. No pertinent surgical history.  Family History  Problem Relation Age of Onset   Healthy Mother    Diabetes Father    Stroke Maternal Uncle     Social History:  reports that he quit smoking about 14 months ago. His smoking use included cigarettes. He has never used smokeless tobacco. He reports current alcohol use. He reports that he does not use drugs.  Allergies: No Known Allergies  Medications: I have reviewed the patient's current medications.  Results for orders placed or performed during the hospital encounter of 06/15/24 (from the past 48 hours)  Comprehensive metabolic panel     Status: Abnormal   Collection Time: 06/15/24 11:20 PM  Result Value Ref Range   Sodium 131 (L) 135 - 145 mmol/L   Potassium 4.0 3.5 - 5.1 mmol/L   Chloride 97 (L) 98 - 111 mmol/L   CO2 24 22 - 32 mmol/L   Glucose, Bld 97 70 - 99 mg/dL    Comment: Glucose reference range applies only to samples taken after fasting for at least 8 hours.   BUN 55 (H) 6 - 20 mg/dL   Creatinine, Ser 7.73 (H) 0.61 - 1.24 mg/dL   Calcium  7.9 (L) 8.9 - 10.3 mg/dL   Total Protein 5.2 (L) 6.5 - 8.1 g/dL   Albumin 3.1  (L) 3.5 - 5.0 g/dL   AST 14 (L) 15 - 41 U/L   ALT 11 0 - 44 U/L   Alkaline Phosphatase 41 38 - 126 U/L   Total Bilirubin 0.2 0.0 - 1.2 mg/dL   GFR, Estimated 34 (L) >60 mL/min    Comment: (NOTE) Calculated using the CKD-EPI Creatinine Equation (2021)    Anion gap 10 5 - 15    Comment: Performed at [facility], [address], [place], [place] [address]  CBC     Status: Abnormal   Collection Time: 06/15/24 11:20 PM  Result Value Ref Range   WBC 0.4 (LL) 4.0 - 10.5 K/uL    Comment: This critical result has been called to HILARIO FLACK, RN by Delon Hoit on 06/15/2024 23:40:36, and has been read back.   RBC 1.37 (L) 4.22 - 5.81 MIL/uL   Hemoglobin 4.6 (LL) 13.0 - 17.0 g/dL    Comment: This critical result has been called to HILARIO FLACK, RN by Delon Hoit on 06/15/2024 23:40:36, and has been read back.   HCT 13.2 (L) 39.0 - 52.0 %   MCV 96.4 80.0 - 100.0 fL   MCH 33.6 26.0 - 34.0 pg   MCHC 34.8 30.0 - 36.0 g/dL   RDW  15.3 11.5 - 15.5 %   Platelets <5 (LL) 150 - 400 K/uL    Comment: Immature Platelet Fraction may be clinically indicated, consider ordering this additional test [id number] This critical result has been called to HILARIO FLACK, RN by Delon Hoit on 06/15/2024 23:40:36, and has been read back.    nRBC 0.0 0.0 - 0.2 %    Comment: Performed at [facility], [address], [place], [place] [address]  CBG monitoring, ED     Status: Abnormal   Collection Time: 06/15/24 11:20 PM  Result Value Ref Range   Glucose-Capillary 107 (H) 70 - 99 mg/dL    Comment: Glucose reference range applies only to samples taken after fasting for at least 8 hours.  ABO/Rh     Status: None   Collection Time: 06/15/24 11:20 PM  Result Value Ref Range   ABO/RH(D)      A POS Performed at [facility], [address] [place], [place] [address]   Type and screen [facility]     Status: None (Preliminary result)    Collection Time: 06/16/24  1:20 AM  Result Value Ref Range   ABO/RH(D) A POS    Antibody Screen NEG    Sample Expiration      06/19/2024,2359 Performed at [facility], [address] [place], [place] [address]    Unit Number [id number]    Blood Component Type RED CELLS,LR    Unit division 00    Status of Unit ISSUED    Transfusion Status OK TO TRANSFUSE    Crossmatch Result Compatible    Unit Number [id number]    Blood Component Type RED CELLS,LR    Unit division 00    Status of Unit ISSUED    Transfusion Status OK TO TRANSFUSE    Crossmatch Result Compatible    Unit Number [id number]    Blood Component Type RED CELLS,LR    Unit division 00    Status of Unit ISSUED    Transfusion Status OK TO TRANSFUSE    Crossmatch Result Compatible    Unit Number [id number]    Blood Component Type RED CELLS,LR    Unit division 00    Status of Unit ALLOCATED    Transfusion Status OK TO TRANSFUSE    Crossmatch Result Compatible    Unit Number [id number]    Blood Component Type RED CELLS,LR    Unit division 00    Status of Unit ALLOCATED    Transfusion Status OK TO TRANSFUSE    Crossmatch Result Compatible   Protime-INR     Status: Abnormal   Collection Time: 06/16/24  1:20 AM  Result Value Ref Range   Prothrombin Time 15.9 (H) 11.4 - 15.2 seconds   INR 1.2 0.8 - 1.2    Comment: (NOTE) INR goal varies based on device and disease states. Performed at [facility], [address] [place], [place] [address]   Prepare RBC (crossmatch)     Status: None   Collection Time: 06/16/24  2:08 AM  Result Value Ref Range   Order Confirmation      ORDER PROCESSED BY BLOOD BANK Performed at [facility], [address] [place], [place] [address]   Prepare platelet pheresis     Status: None (Preliminary result)   Collection Time: 06/16/24  2:08 AM  Result Value Ref Range   Unit Number [id number]    Blood Component  Type PSORALEN TREATED  Unit division 00    Status of Unit ISSUED    Transfusion Status      OK TO TRANSFUSE Performed at [facility], [address] [place], [place] [address]   CBC with Differential     Status: Abnormal   Collection Time: 06/16/24  8:27 AM  Result Value Ref Range   WBC 0.1 (LL) 4.0 - 10.5 K/uL    Comment: CRITICAL VALUE NOTED.  VALUE IS CONSISTENT WITH PREVIOUSLY REPORTED AND CALLED VALUE. REPEATED TO VERIFY This critical result has been called to [place], D by YASMIN Budge on 06/16/2024 09:13:03, and has been read back.    RBC 1.58 (L) 4.22 - 5.81 MIL/uL   Hemoglobin 5.3 (LL) 13.0 - 17.0 g/dL    Comment: REPEATED TO VERIFY This critical result has been called to [place], D by YASMIN Budge on 06/16/2024 09:13:03, and has been read back.    HCT 15.0 (L) 39.0 - 52.0 %   MCV 94.9 80.0 - 100.0 fL   MCH 33.5 26.0 - 34.0 pg   MCHC 35.3 30.0 - 36.0 g/dL   RDW 85.0 88.4 - 84.4 %   Platelets 12 (LL) 150 - 400 K/uL    Comment: SPECIMEN CHECKED FOR CLOTS CRITICAL VALUE NOTED.  VALUE IS CONSISTENT WITH PREVIOUSLY REPORTED AND CALLED VALUE. Immature Platelet Fraction may be clinically indicated, consider ordering this additional test [id number] This critical result has been called to [place], D by YASMIN Budge on 06/16/2024 09:13:03, and has been read back.    nRBC 0.0 0.0 - 0.2 %   Neutrophils Relative % 50 %   Neutro Abs 0.1 (LL) 1.7 - 7.7 K/uL    Comment: This critical result has been called to [place], D by YASMIN Budge on 06/16/2024 09:15:37, and has been read back.   Lymphocytes Relative 42 %   Lymphs Abs 0.1 (L) 0.7 - 4.0 K/uL   Monocytes Relative 0 %   Monocytes Absolute 0.0 (L) 0.1 - 1.0 K/uL   Eosinophils Relative 0 %   Eosinophils Absolute 0.0 0.0 - 0.5 K/uL   Basophils Relative 0 %   Basophils Absolute 0.0 0.0 - 0.1 K/uL   WBC Morphology See Note     Comment: DOHLE BODIES   RBC Morphology MORPHOLOGY UNREMARKABLE    Smear  Review Normal platelet morphology    Immature Granulocytes 8 %   Abs Immature Granulocytes 0.01 0.00 - 0.07 K/uL    Comment: Performed at [facility], [address], [place], [place] [address]  I-Stat CG4 Lactic Acid     Status: None   Collection Time: 06/16/24  8:27 AM  Result Value Ref Range   Lactic Acid, Venous 1.2 0.5 - 1.9 mmol/L  Blood culture (routine x 2)     Status: None (Preliminary result)   Collection Time: 06/16/24  8:27 AM   Specimen: BLOOD LEFT HAND  Result Value Ref Range   Specimen Description      BLOOD LEFT HAND Performed at [facility], [address], [place], [place] [address]    Special Requests      BOTTLES DRAWN AEROBIC AND ANAEROBIC Blood Culture results may not be optimal due to an inadequate volume of blood received in culture bottles Performed at [facility], [address], [place], [place] [address]    Culture      NO GROWTH < 24 HOURS Performed at [facility], [address] [place], [place] [address]  Report Status PENDING   Blood culture (routine x 2)     Status: None (Preliminary result)   Collection Time: 06/16/24  8:30 AM   Specimen: BLOOD  Result Value Ref Range   Specimen Description      BLOOD BLOOD RIGHT WRIST Performed at [facility], [address], [place], [place] [address]    Special Requests      BOTTLES DRAWN AEROBIC AND ANAEROBIC Blood Culture results may not be optimal due to an inadequate volume of blood received in culture bottles Performed at [facility], [address] [place], [place] [address]    Culture      NO GROWTH < 24 HOURS Performed at [facility], [address], [place], [place] [address]    Report Status PENDING   Prepare RBC (crossmatch)     Status: None   Collection Time: 06/16/24  9:30 AM  Result Value Ref Range   Order Confirmation      ORDER PROCESSED BY BLOOD BANK Performed at [facility], [address] [place], [place] [address]   Transfusion reaction     Status: None (Preliminary result)   Collection Time: 06/16/24 10:10 AM  Result Value Ref Range   Post RXN DAT IgG NEG    DAT C3      NEG Performed at [facility], [address], [place], [place] [address]    Path interp tx rxn PENDING   Culture, blood (Routine X 2) w Reflex to ID Panel     Status: None (Preliminary result)   Collection Time: 06/16/24 10:10 AM   Specimen: BLOOD  Result Value Ref Range   Specimen Description      BLOOD OTHER PLATELET UNIT [id number] Performed at [facility], [address] [place], [place] [address]    Special Requests      BOTTLES DRAWN AEROBIC AND ANAEROBIC Blood Culture results may not be optimal due to an inadequate volume of blood received in culture bottles Performed at [facility], [address] [place], [place] [address]    Culture      NO GROWTH < 24 HOURS Performed at [facility], [address], [place], [place] [address]    Report Status PENDING   MRSA Next Gen by PCR, Nasal     Status: None   Collection Time: 06/16/24 12:05 PM   Specimen: Nasal Mucosa; Nasal Swab  Result Value Ref Range   MRSA by PCR Next Gen NOT DETECTED NOT DETECTED    Comment: (NOTE) The GeneXpert MRSA Assay (FDA approved for NASAL specimens only), is one component of a comprehensive MRSA colonization surveillance program. It is not intended to diagnose MRSA infection nor to guide or monitor treatment for MRSA infections. Test performance is not FDA approved in patients less than 47 years old. Performed at [facility], [address] [place], [place] [address]   Prepare platelet pheresis     Status: None (Preliminary result)   Collection Time: 06/16/24 12:13 PM  Result Value Ref Range   Unit Number [id number]    Blood Component Type PLTP1 PSORALEN TREATED    Unit  division 00    Status of Unit ISSUED    Transfusion Status      OK TO TRANSFUSE Performed at [facility], [address], [place], [place] [address]   Urinalysis, Complete w Microscopic -  Status: Abnormal   Collection Time: 06/16/24 12:20 PM  Result Value Ref Range   Color, Urine STRAW (A) YELLOW   APPearance CLEAR CLEAR   Specific Gravity, Urine 1.020 1.005 - 1.030   pH 5.0 5.0 - 8.0   Glucose, UA NEGATIVE NEGATIVE mg/dL   Hgb urine dipstick MODERATE (A) NEGATIVE   Bilirubin Urine NEGATIVE NEGATIVE   Ketones, ur NEGATIVE NEGATIVE mg/dL   Protein, ur NEGATIVE NEGATIVE mg/dL   Nitrite NEGATIVE NEGATIVE   Leukocytes,Ua NEGATIVE NEGATIVE   RBC / HPF 6-10 0 - 5 RBC/hpf   WBC, UA 0-5 0 - 5 WBC/hpf   Bacteria, UA RARE (A) NONE SEEN   Squamous Epithelial / HPF 0-5 0 - 5 /HPF   Mucus PRESENT     Comment: Performed at [facility], [address], [place], [place] [address]  CBC     Status: Abnormal   Collection Time: 06/16/24  7:49 PM  Result Value Ref Range   WBC 0.1 (LL) 4.0 - 10.5 K/uL    Comment: REPEATED TO VERIFY WHITE COUNT CONFIRMED ON SMEAR This critical result has been called to [facility], R. RN by Venetia Deed on 06/16/2024 20:12:26, and has been read back.    RBC 1.73 (L) 4.22 - 5.81 MIL/uL   Hemoglobin 5.5 (LL) 13.0 - 17.0 g/dL    Comment: REPEATED TO VERIFY This critical result has been called to [facility], R. RN by Venetia Deed on 06/16/2024 20:12:26, and has been read back.    HCT 15.6 (L) 39.0 - 52.0 %   MCV 90.2 80.0 - 100.0 fL   MCH 31.8 26.0 - 34.0 pg   MCHC 35.3 30.0 - 36.0 g/dL   RDW 84.0 (H) 88.4 - 84.4 %   Platelets 27 (LL) 150 - 400 K/uL    Comment: SPECIMEN CHECKED FOR CLOTS REPEATED TO VERIFY PLATELET COUNT CONFIRMED BY SMEAR Immature Platelet Fraction may be clinically indicated, consider ordering this additional test [id number]    nRBC 0.0 0.0 - 0.2 %    Comment: Performed at [facility], [address] [place], [place] [address]  Prepare RBC (crossmatch)     Status: None   Collection Time: 06/16/24  8:58 PM  Result Value Ref Range   Order Confirmation      ORDER PROCESSED BY BLOOD BANK Performed at [facility], [address] [place], [place] [address]   Transfusion reaction     Status: None (Preliminary result)   Collection Time: 06/17/24 12:45 AM  Result Value Ref Range   Post RXN DAT IgG NEG    DAT C3 NEG    Path interp tx rxn      PER DR. PATRICK IF ADDITIONAL RBC'S ARE NEEDED CALL HIM FOR PERMISSION  Performed at [facility], [address], [place], [place] [address]   Basic metabolic panel     Status: Abnormal   Collection Time: 06/17/24  1:45 AM  Result Value Ref Range   Sodium 136 135 - 145 mmol/L   Potassium 3.3 (L) 3.5 - 5.1 mmol/L   Chloride 103 98 - 111 mmol/L   CO2 24 22 - 32 mmol/L   Glucose, Bld 94 70 - 99 mg/dL    Comment: Glucose reference range applies only to samples taken after fasting for at least 8 hours.   BUN 53 (H) 6 - 20 mg/dL   Creatinine, Ser 8.12 (H) 0.61 - 1.24 mg/dL   Calcium  8.4 (L) 8.9 -  10.3 mg/dL   GFR, Estimated 43 (L) >60 mL/min    Comment: (NOTE) Calculated using the CKD-EPI Creatinine Equation (2021)    Anion gap 9 5 - 15    Comment: Performed at [facility], [address] [place], [place] [address]  CBC     Status: Abnormal   Collection Time: 06/17/24  1:45 AM  Result Value Ref Range   WBC 0.1 (LL) 4.0 - 10.5 K/uL    Comment: REPEATED TO VERIFY This critical result has been called to VENETTA EDISON RN by Venetia Deed on 06/17/2024 01:59:59, and has been read back.    RBC 1.77 (L) 4.22 - 5.81 MIL/uL   Hemoglobin 6.1 (LL) 13.0 - 17.0 g/dL    Comment: REPEATED TO VERIFY This critical result has been called to SHEE, S. RN by Venetia Deed on 06/17/2024 01:59:59, and has been read back.    HCT 16.6 (L) 39.0 - 52.0 %   MCV 93.8 80.0 - 100.0 fL    MCH 34.5 (H) 26.0 - 34.0 pg   MCHC 36.7 (H) 30.0 - 36.0 g/dL   RDW 83.6 (H) 88.4 - 84.4 %   Platelets 24 (LL) 150 - 400 K/uL    Comment: SPECIMEN CHECKED FOR CLOTS PLATELET COUNT CONFIRMED BY SMEAR REPEATED TO VERIFY Immature Platelet Fraction may be clinically indicated, consider ordering this additional test [id number]    nRBC 0.0 0.0 - 0.2 %    Comment: Performed at [facility], [address] [place], [place] [address]  Urinalysis, Complete w Microscopic -     Status: Abnormal   Collection Time: 06/17/24  2:06 AM  Result Value Ref Range   Color, Urine STRAW (A) YELLOW   APPearance CLEAR CLEAR   Specific Gravity, Urine 1.015 1.005 - 1.030   pH 6.0 5.0 - 8.0   Glucose, UA NEGATIVE NEGATIVE mg/dL   Hgb urine dipstick SMALL (A) NEGATIVE   Bilirubin Urine NEGATIVE NEGATIVE   Ketones, ur NEGATIVE NEGATIVE mg/dL   Protein, ur NEGATIVE NEGATIVE mg/dL   Nitrite NEGATIVE NEGATIVE   Leukocytes,Ua NEGATIVE NEGATIVE   RBC / HPF 0-5 0 - 5 RBC/hpf   WBC, UA 0-5 0 - 5 WBC/hpf   Bacteria, UA RARE (A) NONE SEEN   Squamous Epithelial / HPF 0-5 0 - 5 /HPF   Mucus PRESENT     Comment: Performed at [facility], [address], [place], [place] [address]  Prepare RBC (crossmatch)     Status: None   Collection Time: 06/17/24 10:04 AM  Result Value Ref Range   Order Confirmation      ORDER PROCESSED BY BLOOD BANK Performed at [facility], [address] [place], [place] [address]     DG Chest Portable 1 View Result Date: 06/16/2024 CLINICAL DATA:  SOB EXAM: DG CHEST 1V PORT COMPARISON:  04/01/2022 FINDINGS: Right chest port in place terminating at the cavoatrial junction. No focal airspace consolidation, pleural effusion, or pneumothorax. No cardiomegaly. No acute fracture or destructive lesions. Multilevel thoracic osteophytosis. IMPRESSION: No acute cardiopulmonary abnormality. Electronically Signed   By: Rogelia Myers M.D.    On: 06/16/2024 08:56   CT Head W or Wo Contrast Result Date: 06/16/2024 EXAM: CT HEAD WITHOUT AND WITH 06/16/2024 12:46:58 AM TECHNIQUE: CT of the head was performed without and with the administration of 50 mL of iohexol  (OMNIPAQUE ) 300 MG/ML solution. Automated exposure control, iterative reconstruction, and/or weight based adjustment of the mA/kV was utilized to  reduce the radiation dose to as low as reasonably achievable. COMPARISON: None available. CLINICAL HISTORY: Brain metastases suspected. FINDINGS: BRAIN AND VENTRICLES: No acute intracranial hemorrhage. No mass effect or midline shift. No extra-axial fluid collection. No evidence of acute infarct. No hydrocephalus. There is extensive bony destruction of the clivus with the right cavernous sinus and sella. ORBITS: Orbits are unremarkable. SINUSES AND MASTOIDS: Extensive mucosal thickening and fluid opacification of the visualized paranasal sinuses. Fluid opacification of the left middle ear cavity and mastoid air cells. Right middle ear cavity and mastoid air cells are clear. SOFT TISSUES AND SKULL: No acute skull fracture. No acute soft tissue abnormality. IMPRESSION: 1. Extensive bony destruction of the clivus with involvement of the right cavernous sinus and sella. 2. Extensive mucosal thickening and fluid opacification of the visualized paranasal sinuses. 3. Fluid opacification of the left middle ear cavity and mastoid air cells. Electronically signed by: Dorethia Molt MD 06/16/2024 01:19 AM EST RP Workstation: [id number]   CT Maxillofacial W Contrast Result Date: 06/16/2024 EXAM: CT Face With contrast 06/16/2024 12:46:58 AM TECHNIQUE: CT of the face was performed with the administration of intravenous contrast. 50 mL iohexol  (OMNIPAQUE ) 300 MG/ML solution was administered. Multiplanar reformatted images are provided for review. Automated exposure control, iterative reconstruction, and/or weight based adjustment of the mA/kV was utilized to reduce  the radiation dose to as low as reasonably achievable. COMPARISON: None available CLINICAL HISTORY: epistaxis; h/o nasopharyngeal cancer FINDINGS: AERODIGESTIVE TRACT: Frothy secretions are seen within the nasopharynx and posteriorly within the oropharynx and hypopharynx also representing blood products. Extensive soft tissue is seen anterior to the clivus corresponding to no malignancy, however, this is not well delineated in the absence of contrast administration. No mass. No edema. SALIVARY GLANDS: No acute abnormality. LYMPH NODES: No suspicious cervical lymphadenopathy. SOFT TISSUES: Extensive soft tissue is seen anterior to the clivus corresponding to no malignancy, however, this is not well delineated in the absence of contrast administration. No mass or fluid collection. BRAIN, ORBITS AND SINUSES: Visualized intracranial contents are unremarkable. Orbits are unremarkable. Extensive permeative destruction of the clivus related to the patient's known nasopharyngeal carcinoma. Erosive changes involve the posterior walls of the sphenoid sinuses bilaterally and possibly involves the right cavernous sinus (48/13, 63/9). There is extensive mucosal thickening throughout the paranasal sinuses. High attenuation fluid is seen within the left maxillary sinus, likely representing blood products. The left medial pterygoid plate is eroded. Fluid opacification of the left mastoid air cells and middle ear cavity. BONES: No acute facial fracture identified. No mandibular dislocation. Extensive permeative destruction of the clivus related to the patient's known nasopharyngeal carcinoma. Erosive changes involve the posterior walls of the sphenoid sinuses bilaterally and possibly involves the right cavernous sinus (48/13, 63/9). The left medial pterygoid plate is eroded. IMPRESSION: 1. Extensive permeative destruction of the clivus related to nasopharyngeal carcinoma, with associated soft tissue anterior to the clivus (not well  delineated without contrast), erosive changes involving the posterior walls of the sphenoid sinuses bilaterally with possible right cavernous sinus involvement, and erosion of the left medial pterygoid plate. 2. High attenuation fluid in the left maxillary sinus likely representing blood products. 3. Fluid opacification of the left middle ear and mastoid air cells. Electronically signed by: Dorethia Molt MD 06/16/2024 01:07 AM EST RP Workstation: [id number]   CT Cervical Spine Wo Contrast Result Date: 06/16/2024 EXAM: CT CERVICAL SPINE WITHOUT CONTRAST 06/16/2024 12:46:58 AM TECHNIQUE: CT of the cervical spine was performed without the administration of intravenous contrast. Multiplanar reformatted  images are provided for review. Automated exposure control, iterative reconstruction, and/or weight based adjustment of the mA/kV was utilized to reduce the radiation dose to as low as reasonably achievable. COMPARISON: None available. CLINICAL HISTORY: recurring falls; h/o schizo ->inconsistent story FINDINGS: CERVICAL SPINE: BONES AND ALIGNMENT: There is extensive permeative destruction of the clivus itself. Craniocervical alignment is normal. No acute fracture of the cervical spine. Vertebral body height is preserved. DEGENERATIVE CHANGES: Endplate changes at C5-C7 is present in keeping with changes of minimal degenerative disc disease. No high-grade canal stenosis. No high-grade neural foraminal narrowing. SOFT TISSUES: Preclival soft tissue thickening at the base of the skull, incompletely included on this examination. IMPRESSION: 1. Extensive permeative destruction of the clivus with associated preclival soft tissue thickening, incompletely included on this examination. This corresponds to the patient's known nasopharyngeal carcinoma that is seen on MRI examination of 12/04/2023 but is not well assessed on this noncontrast examination. 2. No acute fracture of the cervical spine. Electronically signed by: Dorethia Molt MD 06/16/2024 12:57 AM EST RP Workstation: [id number]    Review of Systems  Constitutional:  Positive for fatigue.  HENT:  Positive for congestion and nosebleeds.   All other systems reviewed and are negative.  Blood pressure (!) 164/99, pulse (!) 108, temperature 98.8 F (37.1 C), temperature source Axillary, resp. rate 14, height 6' 1 (1.854 m), weight 75.6 kg, SpO2 98%. Physical Exam Constitutional:      Appearance: Normal appearance. He is normal weight.  HENT:     Head: Normocephalic and atraumatic.     Right Ear: External ear normal.     Left Ear: External ear normal.     Nose:     Comments: Dry blood in both nares.  After removal, anterior exam unremarkable on both sides.  Septum deviates to left.    Mouth/Throat:     Comments: Clot hanging in pharynx.  No active bleeding. Eyes:     Comments: Some abnormality to EOM.  Cardiovascular:     Rate and Rhythm: Normal rate.  Pulmonary:     Effort: Pulmonary effort is normal.  Skin:    General: Skin is warm and dry.  Neurological:     General: No focal deficit present.     Mental Status: He is alert and oriented to person, place, and time.  Psychiatric:        Mood and Affect: Mood normal.        Behavior: Behavior normal.        Thought Content: Thought content normal.        Judgment: Judgment normal.     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  I attempted fiberoptic exam of the nasopharynx but was limited by his septal deviation and discomfort.  He is not actively bleeding.  I discussed his situation with him and nursing.  If bleeding does not resume significantly, I recommend no specific intervention here, awaiting transfer to [facility].  If bleeding resumes significantly, management will best be considered with interventional radiology and embolization.  Vaughan Ricker 06/17/2024, 10:46 AM

## 2024-06-17 NOTE — Progress Notes (Signed)
 [facility] PROGRESS NOTE   [name] FMW:[id number] DOB: Dec 31, 1970 DOA: 06/15/2024  PCP: Patient, No Pcp Per  Brief History: 53 y.o. 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  Patient states he has never had a significant nosebleed in the past, he started bleeding through his nose last night, became very weak and dizzy, had 1 episode of nausea and vomiting.  Multiple times when he tried to get up at home, he passed out and fell to the ground, not sure if he hit his head.  Workup in the emergency department shows severe pancytopenia, CT of the head and neck without acute findings.  Patient was transfused 1 unit of blood overnight, and platelet transfusion was started this morning.  However he had a fever and became hypoxic, so platelet transfusion has been held.  ER provider attempted to transfer patient to [facility], but they were unable to accept due to capacity.   Consultants: Phone discussion with hematology oncology, Dr. Timmy.  Phone discussion with Dr. Carlie with ENT.  Procedures: None yet    Subjective/Interval History: Patient denies any pain in his facial region.  Denies any pain in his nose.  Denies any dizziness or lightheadedness while in the bed.  Denies any nausea or vomiting.    Assessment/Plan:  Epistaxis in the setting of nasopharyngeal carcinoma No anterior bleeding noted at this morning.  Apparently his bleeding is stopped.  There is some blood in the retropharyngeal area. Low hemoglobin noted.  PRBCs were ordered however patient developed significant fever so they were discontinued.  See below. Case discussed with Dr. Carlie with ENT this morning.  He will review chart and get back to us  regarding further management. Afrin nasal sprays have been  ordered. Imaging studies suggested fluid in the maxillary sinus which is thought to be blood.  Acute blood loss anemia There is also component of chemotherapy-induced pancytopenia.  His last chemotherapy was on 11/25 at [facility]. Epistaxis also contributed to his anemia.  Patient was ordered blood transfusions however he developed significant fever so transfusion was held early this morning.  Discussed with Dr. Timmy with hematology.  He recommends premedication with Tylenol  Benadryl  and Solu-Medrol  and trying transfusion again.  This has been ordered.  Hemoglobin noted to be 6.1 this morning.  Neutropenia with fever Most likely secondary to chemotherapy.  Last chemotherapy was on 11/25 and he received gemcitabine .  Does not appear that he received any marrow stimulating agents.  Neupogen  has been ordered. Patient is on cefepime .  Follow-up on cultures. CBC with differential is to be checked every day. No obvious source of infection identified.  Chest x-ray was unremarkable.  UA did not suggest infection.  Severe thrombocytopenia Initially platelet counts were less than 5.  Received half a pack of platelets but had to be paused due to fever.  Platelet counts noted to be 24,000 this morning.  Continue to monitor daily for now.  Nasopharyngeal carcinoma Managed at [facility].  Currently getting immunotherapy and chemotherapy.  Last chemotherapy with gemcitabine  on 11/25. CT scan of the maxillofacial region showed permeative destruction of the clivus related to his cancer.  Changes in the cavernous sinus also noted.  Care everywhere reviewed.  Patient had MRI brain in September which appeared to show similar findings.  Will let this be managed at [facility].  Acute kidney injury on chronic kidney  disease stage IIIa Baseline creatinine 1.6.  Came in with creatinine of 2.26.  Improved to 1.87 this morning. Will continue with IV fluids.  Avoid nephrotoxic agents.  Hypokalemia Will be supplemented.  Essential  hypertension Antihypertensives on hold.  Hypothyroidism Continue levothyroxine  and Armour Thyroid .  History of schizophrenia Noted to be on multiple psychotropic agents.  DVT Prophylaxis: SCDs only for now Code Status: Full code Family Communication: No family at bedside.  Will update his mother later today Disposition Plan: Hopefully return home when improved.  Status is: Inpatient Remains inpatient appropriate because: Pancytopenia, neutropenic fever      Medications: Scheduled:  sodium chloride    Intravenous Once   acetaminophen   650 mg Oral Once   atorvastatin   40 mg Oral Daily   Chlorhexidine  Gluconate Cloth  6 each Topical Daily   diphenhydrAMINE   25 mg Intravenous Once   filgrastim  (NIVESTYM ) SQ  480 mcg Subcutaneous q1800   methylPREDNISolone  (SOLU-MEDROL ) injection  80 mg Intravenous Once   oxymetazoline   1 spray Each Nare BID   sodium chloride  flush  10-40 mL Intracatheter Q12H   Continuous:  ceFEPime  (MAXIPIME ) IV 2 g (06/17/24 0840)   PRN:acetaminophen  **OR** acetaminophen , albuterol , fentaNYL  (SUBLIMAZE ) injection, ondansetron  **OR** ondansetron  (ZOFRAN ) IV, oxyCODONE , sodium chloride  flush  Antibiotics: Anti-infectives (From admission, onward)    Start     Dose/Rate Route Frequency Ordered Stop   06/16/24 0845  ceFEPIme  (MAXIPIME ) 2 g in sodium chloride  0.9 % 100 mL IVPB        2 g 200 mL/hr over 30 Minutes Intravenous Every 12 hours 06/16/24 0829     06/16/24 0845  vancomycin  (VANCOREADY) IVPB 1750 mg/350 mL        1,750 mg 175 mL/hr over 120 Minutes Intravenous  Once 06/16/24 0831 06/16/24 1742       Objective:  Vital Signs  Vitals:   06/17/24 0300 06/17/24 0400 06/17/24 0500 06/17/24 0830  BP: 101/63 (!) 123/59 122/73   Pulse: 100 97 95   Resp: 15 16 14    Temp:  98.7 F (37.1 C)  98.8 F (37.1 C)  TempSrc:  Axillary  Axillary  SpO2: 95% 95% 98%   Weight:      Height:        Intake/Output Summary (Last 24 hours) at 06/17/2024  0939 Last data filed at 06/17/2024 0600 Gross per 24 hour  Intake 2295.51 ml  Output 1500 ml  Net 795.51 ml   Filed Weights   06/16/24 1333  Weight: 75.6 kg    General appearance: Awake alert.  In no distress Crusted blood noted in the naris bilaterally.  Fresh blood noted in the pharyngeal area. Resp: Clear to auscultation bilaterally.  Normal effort Cardio: S1-S2 is normal regular.  No S3-S4.  No rubs murmurs or bruit GI: Abdomen is soft.  Nontender nondistended.  Bowel sounds are present normal.  No masses organomegaly Extremities: No edema.  Full range of motion of lower extremities. Neurologic:  No focal neurological deficits.    Lab Results:  Data Reviewed: I have personally reviewed following labs and reports of the imaging studies  CBC: Recent Labs  Lab 06/15/24 2320 06/16/24 0827 06/16/24 1949 06/17/24 0145  WBC 0.4* 0.1* 0.1* 0.1*  NEUTROABS  --  0.1*  --   --   HGB 4.6* 5.3* 5.5* 6.1*  HCT 13.2* 15.0* 15.6* 16.6*  MCV 96.4 94.9 90.2 93.8  PLT <5* 12* 27* 24*    Basic Metabolic Panel: Recent Labs  Lab 06/15/24 2320 06/17/24  0145  NA 131* 136  K 4.0 3.3*  CL 97* 103  CO2 24 24  GLUCOSE 97 94  BUN 55* 53*  CREATININE 2.26* 1.87*  CALCIUM  7.9* 8.4*    GFR: Estimated Creatinine Clearance: 50 mL/min (A) (by C-G formula based on SCr of 1.87 mg/dL (H)).  Liver Function Tests: Recent Labs  Lab 06/15/24 2320  AST 14*  ALT 11  ALKPHOS 41  BILITOT 0.2  PROT 5.2*  ALBUMIN 3.1*   Coagulation Profile: Recent Labs  Lab 06/16/24 0120  INR 1.2    CBG: Recent Labs  Lab 06/15/24 2320  GLUCAP 107*    Recent Results (from the past 240 hours)  Blood culture (routine x 2)     Status: None (Preliminary result)   Collection Time: 06/16/24  8:27 AM   Specimen: BLOOD LEFT HAND  Result Value Ref Range Status   Specimen Description   Final    BLOOD LEFT HAND Performed at [facility], [address], [place], [place] [address]    Special  Requests   Final    BOTTLES DRAWN AEROBIC AND ANAEROBIC Blood Culture results may not be optimal due to an inadequate volume of blood received in culture bottles Performed at [facility], [address], [place], [place] [address]    Culture PENDING  Incomplete   Report Status PENDING  Incomplete  MRSA Next Gen by PCR, Nasal     Status: None   Collection Time: 06/16/24 12:05 PM   Specimen: Nasal Mucosa; Nasal Swab  Result Value Ref Range Status   MRSA by PCR Next Gen NOT DETECTED NOT DETECTED Final    Comment: (NOTE) The GeneXpert MRSA Assay (FDA approved for NASAL specimens only), is one component of a comprehensive MRSA colonization surveillance program. It is not intended to diagnose MRSA infection nor to guide or monitor treatment for MRSA infections. Test performance is not FDA approved in patients less than 77 years old. Performed at [facility], [address] [place], [place] [address]       Radiology Studies: DG Chest Portable 1 View Result Date: 06/16/2024 CLINICAL DATA:  SOB EXAM: DG CHEST 1V PORT COMPARISON:  04/01/2022 FINDINGS: Right chest port in place terminating at the cavoatrial junction. No focal airspace consolidation, pleural effusion, or pneumothorax. No cardiomegaly. No acute fracture or destructive lesions. Multilevel thoracic osteophytosis. IMPRESSION: No acute cardiopulmonary abnormality. Electronically Signed   By: Rogelia Myers M.D.   On: 06/16/2024 08:56   CT Head W or Wo Contrast Result Date: 06/16/2024 EXAM: CT HEAD WITHOUT AND WITH 06/16/2024 12:46:58 AM TECHNIQUE: CT of the head was performed without and with the administration of 50 mL of iohexol  (OMNIPAQUE ) 300 MG/ML solution. Automated exposure control, iterative reconstruction, and/or weight based adjustment of the mA/kV was utilized to reduce the radiation dose to as low as reasonably achievable. COMPARISON: None available. CLINICAL HISTORY: Brain metastases  suspected. FINDINGS: BRAIN AND VENTRICLES: No acute intracranial hemorrhage. No mass effect or midline shift. No extra-axial fluid collection. No evidence of acute infarct. No hydrocephalus. There is extensive bony destruction of the clivus with the right cavernous sinus and sella. ORBITS: Orbits are unremarkable. SINUSES AND MASTOIDS: Extensive mucosal thickening and fluid opacification of the visualized paranasal sinuses. Fluid opacification of the left middle ear cavity and mastoid air cells. Right middle ear cavity and mastoid air cells are clear. SOFT TISSUES AND SKULL: No acute skull fracture. No acute soft tissue abnormality. IMPRESSION: 1. Extensive  bony destruction of the clivus with involvement of the right cavernous sinus and sella. 2. Extensive mucosal thickening and fluid opacification of the visualized paranasal sinuses. 3. Fluid opacification of the left middle ear cavity and mastoid air cells. Electronically signed by: Dorethia Molt MD 06/16/2024 01:19 AM EST RP Workstation: [id number]   CT Maxillofacial W Contrast Result Date: 06/16/2024 EXAM: CT Face With contrast 06/16/2024 12:46:58 AM TECHNIQUE: CT of the face was performed with the administration of intravenous contrast. 50 mL iohexol  (OMNIPAQUE ) 300 MG/ML solution was administered. Multiplanar reformatted images are provided for review. Automated exposure control, iterative reconstruction, and/or weight based adjustment of the mA/kV was utilized to reduce the radiation dose to as low as reasonably achievable. COMPARISON: None available CLINICAL HISTORY: epistaxis; h/o nasopharyngeal cancer FINDINGS: AERODIGESTIVE TRACT: Frothy secretions are seen within the nasopharynx and posteriorly within the oropharynx and hypopharynx also representing blood products. Extensive soft tissue is seen anterior to the clivus corresponding to no malignancy, however, this is not well delineated in the absence of contrast administration. No mass. No edema.  SALIVARY GLANDS: No acute abnormality. LYMPH NODES: No suspicious cervical lymphadenopathy. SOFT TISSUES: Extensive soft tissue is seen anterior to the clivus corresponding to no malignancy, however, this is not well delineated in the absence of contrast administration. No mass or fluid collection. BRAIN, ORBITS AND SINUSES: Visualized intracranial contents are unremarkable. Orbits are unremarkable. Extensive permeative destruction of the clivus related to the patient's known nasopharyngeal carcinoma. Erosive changes involve the posterior walls of the sphenoid sinuses bilaterally and possibly involves the right cavernous sinus (48/13, 63/9). There is extensive mucosal thickening throughout the paranasal sinuses. High attenuation fluid is seen within the left maxillary sinus, likely representing blood products. The left medial pterygoid plate is eroded. Fluid opacification of the left mastoid air cells and middle ear cavity. BONES: No acute facial fracture identified. No mandibular dislocation. Extensive permeative destruction of the clivus related to the patient's known nasopharyngeal carcinoma. Erosive changes involve the posterior walls of the sphenoid sinuses bilaterally and possibly involves the right cavernous sinus (48/13, 63/9). The left medial pterygoid plate is eroded. IMPRESSION: 1. Extensive permeative destruction of the clivus related to nasopharyngeal carcinoma, with associated soft tissue anterior to the clivus (not well delineated without contrast), erosive changes involving the posterior walls of the sphenoid sinuses bilaterally with possible right cavernous sinus involvement, and erosion of the left medial pterygoid plate. 2. High attenuation fluid in the left maxillary sinus likely representing blood products. 3. Fluid opacification of the left middle ear and mastoid air cells. Electronically signed by: Dorethia Molt MD 06/16/2024 01:07 AM EST RP Workstation: [id number]   CT Cervical Spine Wo  Contrast Result Date: 06/16/2024 EXAM: CT CERVICAL SPINE WITHOUT CONTRAST 06/16/2024 12:46:58 AM TECHNIQUE: CT of the cervical spine was performed without the administration of intravenous contrast. Multiplanar reformatted images are provided for review. Automated exposure control, iterative reconstruction, and/or weight based adjustment of the mA/kV was utilized to reduce the radiation dose to as low as reasonably achievable. COMPARISON: None available. CLINICAL HISTORY: recurring falls; h/o schizo ->inconsistent story FINDINGS: CERVICAL SPINE: BONES AND ALIGNMENT: There is extensive permeative destruction of the clivus itself. Craniocervical alignment is normal. No acute fracture of the cervical spine. Vertebral body height is preserved. DEGENERATIVE CHANGES: Endplate changes at C5-C7 is present in keeping with changes of minimal degenerative disc disease. No high-grade canal stenosis. No high-grade neural foraminal narrowing. SOFT TISSUES: Preclival soft tissue thickening at the base of the skull, incompletely included on this  examination. IMPRESSION: 1. Extensive permeative destruction of the clivus with associated preclival soft tissue thickening, incompletely included on this examination. This corresponds to the patient's known nasopharyngeal carcinoma that is seen on MRI examination of 12/04/2023 but is not well assessed on this noncontrast examination. 2. No acute fracture of the cervical spine. Electronically signed by: Dorethia Molt MD 06/16/2024 12:57 AM EST RP Workstation: [id number]       LOS: 1 day   Joette Pebbles  [facility] Pager on [email or web address]  06/17/2024, 9:39 AM

## 2024-06-18 LAB — HEMOGLOBIN AND HEMATOCRIT, BLOOD
HCT: 21 % — ABNORMAL LOW (ref 39.0–52.0)
Hemoglobin: 7.5 g/dL — ABNORMAL LOW (ref 13.0–17.0)

## 2024-06-18 LAB — CBC WITH DIFFERENTIAL/PLATELET
HCT: 19.7 % — ABNORMAL LOW (ref 39.0–52.0)
Hemoglobin: 7.1 g/dL — ABNORMAL LOW (ref 13.0–17.0)
MCH: 32.9 pg (ref 26.0–34.0)
MCHC: 36 g/dL (ref 30.0–36.0)
MCV: 91.2 fL (ref 80.0–100.0)
Platelets: 17 K/uL — CL (ref 150–400)
RBC: 2.16 MIL/uL — ABNORMAL LOW (ref 4.22–5.81)
RDW: 15.5 % (ref 11.5–15.5)
WBC: 0.2 K/uL — CL (ref 4.0–10.5)
nRBC: 0 % (ref 0.0–0.2)

## 2024-06-18 LAB — COMPREHENSIVE METABOLIC PANEL WITH GFR
ALT: 207 U/L — ABNORMAL HIGH (ref 0–44)
AST: 272 U/L — ABNORMAL HIGH (ref 15–41)
Albumin: 2.9 g/dL — ABNORMAL LOW (ref 3.5–5.0)
Alkaline Phosphatase: 72 U/L (ref 38–126)
Anion gap: 10 (ref 5–15)
BUN: 47 mg/dL — ABNORMAL HIGH (ref 6–20)
CO2: 22 mmol/L (ref 22–32)
Calcium: 8.6 mg/dL — ABNORMAL LOW (ref 8.9–10.3)
Chloride: 104 mmol/L (ref 98–111)
Creatinine, Ser: 1.79 mg/dL — ABNORMAL HIGH (ref 0.61–1.24)
GFR, Estimated: 45 mL/min — ABNORMAL LOW (ref >60)
Glucose, Bld: 150 mg/dL — ABNORMAL HIGH (ref 70–99)
Potassium: 3.6 mmol/L (ref 3.5–5.1)
Sodium: 136 mmol/L (ref 135–145)
Total Bilirubin: 0.4 mg/dL (ref 0.0–1.2)
Total Protein: 5.5 g/dL — ABNORMAL LOW (ref 6.5–8.1)

## 2024-06-18 MED ORDER — AMLODIPINE BESYLATE 10 MG PO TABS
10.0000 mg | ORAL_TABLET | Freq: Every day | ORAL | Status: DC
  Administered 2024-06-18 – 2024-06-20 (×3): 10 mg via ORAL
  Filled 2024-06-18 (×3): qty 1

## 2024-06-18 MED ORDER — HYDRALAZINE HCL 25 MG PO TABS
25.0000 mg | ORAL_TABLET | Freq: Four times a day (QID) | ORAL | Status: DC | PRN
  Administered 2024-06-18 (×2): 25 mg via ORAL
  Filled 2024-06-18 (×3): qty 1

## 2024-06-18 NOTE — Evaluation (Signed)
 Physical Therapy Evaluation Patient Details Name: [name] MRN: [id number] DOB: Mar 03, 1971 Today's Date: 06/18/2024  History of Present Illness  Pt admitted from home 2* nosebleed, epistaxis, pancytopenia, anemia, multiple syncopal episodes.  Pt with hx of paranoid schizophrenia, and recurrent nasopharyngeal CA (being treated at [facility]).  Clinical Impression  Pt admitted as above and presenting with functional mobility limitations 2* generalized weakness, decreased activity tolerance, and mild ambulatory balance deficits.  Pt very motivated to regain IND and return to prior living arrangement.          If plan is discharge home, recommend the following:     Can travel by private vehicle        Equipment Recommendations None recommended by PT  Recommendations for Other Services       Functional Status Assessment       Precautions / Restrictions Precautions Precautions: Fall Restrictions Weight Bearing Restrictions Per Provider Order: No      Mobility  Bed Mobility Overal bed mobility: Modified Independent             General bed mobility comments: No assist in/out bed    Transfers Overall transfer level: Needs assistance Equipment used: None Transfers: Sit to/from Stand, Bed to chair/wheelchair/BSC Sit to Stand: Contact guard assist   Step pivot transfers: Contact guard assist       General transfer comment: Steady assist only    Ambulation/Gait Ambulation/Gait assistance: Min assist Gait Distance (Feet): 300 Feet Assistive device: Rolling walker (2 wheels), None Gait Pattern/deviations: Step-through pattern, Decreased step length - right, Decreased step length - left, Shuffle, Trunk flexed Gait velocity: mod pace     General Gait Details: Pt ambulated 150' with RW and additional 150 sans AD with one episode mild balance loss.  Stairs            Wheelchair Mobility     Tilt Bed    Modified Rankin (Stroke Patients Only)        Balance Overall balance assessment: Needs assistance Sitting-balance support: No upper extremity supported, Feet supported Sitting balance-Leahy Scale: Good     Standing balance support: No upper extremity supported Standing balance-Leahy Scale: Fair                               Pertinent Vitals/Pain Pain Assessment Pain Assessment: No/denies pain    Home Living Family/patient expects to be discharged to:: Private residence Living Arrangements: Alone Available Help at Discharge: Available PRN/intermittently (mother can help with groceries etc) Type of Home: Apartment Home Access: Stairs to enter   Secretary/administrator of Steps: 1 Alternate Level Stairs-Number of Steps: flight Home Layout: Two level Home Equipment: None      Prior Function Prior Level of Function : Independent/Modified Independent                     Extremity/Trunk Assessment   Upper Extremity Assessment Upper Extremity Assessment: Generalized weakness    Lower Extremity Assessment Lower Extremity Assessment: Generalized weakness       Communication   Communication Communication: No apparent difficulties Factors Affecting Communication: Hearing impaired    Cognition Arousal: Alert Behavior During Therapy: WFL for tasks assessed/performed   PT - Cognitive impairments: No apparent impairments                         Following commands: Intact       Cueing Cueing  Techniques: Verbal cues, Gestural cues     General Comments      Exercises     Assessment/Plan    PT Assessment Patient needs continued PT services  PT Problem List Decreased strength;Decreased range of motion;Decreased activity tolerance;Decreased balance;Decreased mobility;Decreased knowledge of use of DME       PT Treatment Interventions DME instruction;Gait training;Stair training;Functional mobility training;Therapeutic activities;Therapeutic exercise;Patient/family  education;Balance training    PT Goals (Current goals can be found in the Care Plan section)  Acute Rehab PT Goals Patient Stated Goal: Regain IND and return home PT Goal Formulation: With patient Time For Goal Achievement: 07/02/24 Potential to Achieve Goals: Good    Frequency Min 2X/week     Co-evaluation               AM-PAC PT 6 Clicks Mobility  Outcome Measure Help needed turning from your back to your side while in a flat bed without using bedrails?: None Help needed moving from lying on your back to sitting on the side of a flat bed without using bedrails?: None Help needed moving to and from a bed to a chair (including a wheelchair)?: A Little Help needed standing up from a chair using your arms (e.g., wheelchair or bedside chair)?: A Little Help needed to walk in hospital room?: A Little Help needed climbing 3-5 steps with a railing? : A Little 6 Click Score: 20    End of Session Equipment Utilized During Treatment: Gait belt Activity Tolerance: Patient tolerated treatment well;Patient limited by fatigue Patient left: in bed;with call bell/phone within reach;with bed alarm set Nurse Communication: Mobility status PT Visit Diagnosis: Difficulty in walking, not elsewhere classified (R26.2)    Time: 8846-8783 PT Time Calculation (min) (ACUTE ONLY): 23 min   Charges:   PT Evaluation $PT Eval Low Complexity: 1 Low PT Treatments $Gait Training: 8-22 mins PT General Charges $$ ACUTE PT VISIT: 1 Visit         [facility] PT Acute Rehabilitation Services Office [phone]   Ryler Laskowski 06/18/2024, 12:53 PM

## 2024-06-18 NOTE — Progress Notes (Signed)
 PROGRESS NOTE    [name]  FMW:[id number] DOB: 28-Oct-1970 DOA: 06/15/2024 PCP: Patient, No Pcp Per   Brief Narrative:  53 y.o. 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  On presentation, he was found to be pancytopenic.  CT of the head and neck did not show any acute findings.  Patient was transfused packed red cells and platelets.  He had some fever after transfusion for which she was given Solu-Medrol  and Benadryl .  ER provider tried to transfer patient to [facility] but they were unable to accept due to capacity.  Oncology and ENT were consulted.  Assessment & Plan:   Epistaxis in the setting of nasopharyngeal carcinoma Acute blood loss anemia - Nosebleeding is resolved.  ENT evaluation appreciated: Recommended no specific intervention and if bleeding resumes significantly, patient will need IR embolization. -Has required 5 units packed red cells and 2 units platelet transfusion so far during this hospitalization.  Had transfusion reaction and needed premedication with Tylenol , Benadryl  and Solu-Medrol .  Pancytopenia Nasopharyngeal carcinoma - Currently being managed at [facility] with immunotherapy and chemotherapy for the last chemotherapy with gemcitabine  on 06/06/2024.  Will need to follow-up with [facility] as an outpatient for further treatment. ER provider tried to transfer patient to [facility] but they were unable to accept due to capacity. - Oncology/Dr. Timmy following.  Patient has had 5 units packed red cells and 2 units platelet transfusion so far during this hospitalization.  Hemoglobin 7.1 this morning and platelets are 17.  Follow further oncology recommendations.  Still neutropenic.  Currently getting filgrastim  as per oncology.  Continue to monitor CBC daily.  AKI on CKD stage IIIa -  Patient creatinine of 1.6.  Creatinine 2.06 on presentation.  Improving to 1.79 today.  Treated with IV fluids and subsequently discontinued.  Monitor  Essential hypertension - Blood pressure extremely elevated.  Use as needed hydralazine .  Losartan  on hold.  Start scheduled amlodipine .  Hypokalemia - Improved  Hyponatremia -Improved  Elevated LFTs - Questionable cause.  AST and ALT have significantly elevated today without elevation of alk phos and bili total.  DC statin.  Repeat a.m. LFTs.  Hypothyroidism -continue levothyroxine   History of schizophrenia - Continue Depakote , risperidone  and benztropine .  Outpatient follow-up with psychiatry    DVT prophylaxis: SCDs Code Status: Full Family Communication: None at bedside Disposition Plan: Status is: Inpatient Remains inpatient appropriate because: Of severity of illness    Consultants: Oncology/ENT  Procedures: None  Antimicrobials: None   Subjective: Patient seen and examined at bedside.  Denies any more nosebleeding.  No fever, agitation, vomiting reported.  Objective: Vitals:   06/18/24 1015 06/18/24 1030 06/18/24 1045 06/18/24 1053  BP: (!) 176/101 (!) 169/107 (!) 172/96 (!) 172/96  Pulse: 83 80 71   Resp: (!) 38 19 15   Temp:      TempSrc:      SpO2: (!) 84% 98% 97%   Weight:      Height:        Intake/Output Summary (Last 24 hours) at 06/18/2024 1056 Last data filed at 06/18/2024 1054 Gross per 24 hour  Intake 2318.41 ml  Output 1400 ml  Net 918.41 ml   Filed Weights   06/16/24 1333  Weight: 75.6 kg    Examination:  General exam: Appears calm and comfortable.  On room air. Respiratory system: Bilateral decreased breath sounds at bases with  scattered crackles Cardiovascular system: S1 & S2 heard, Rate controlled Gastrointestinal system: Abdomen is nondistended, soft and nontender. Normal bowel sounds heard. Extremities: No cyanosis, clubbing, edema  Central nervous system: Alert and  oriented.  Slow to respond.  Poor historian.  No focal neurological deficits. Moving extremities Skin: No rashes, lesions or ulcers Psychiatry: Flat affect.  Not agitated   Data Reviewed: I have personally reviewed following labs and imaging studies  CBC: Recent Labs  Lab 06/15/24 2320 06/16/24 0827 06/16/24 1949 06/17/24 0145 06/17/24 1700 06/18/24 0135  WBC 0.4* 0.1* 0.1* 0.1*  --  0.2*  NEUTROABS  --  0.1*  --   --   --   --   HGB 4.6* 5.3* 5.5* 6.1* 6.6* 7.1*  HCT 13.2* 15.0* 15.6* 16.6* 18.6* 19.7*  MCV 96.4 94.9 90.2 93.8  --  91.2  PLT <5* 12* 27* 24*  --  17*   Basic Metabolic Panel: Recent Labs  Lab 06/15/24 2320 06/17/24 0145 06/18/24 0135  NA 131* 136 136  K 4.0 3.3* 3.6  CL 97* 103 104  CO2 24 24 22   GLUCOSE 97 94 150*  BUN 55* 53* 47*  CREATININE 2.26* 1.87* 1.79*  CALCIUM  7.9* 8.4* 8.6*   GFR: Estimated Creatinine Clearance: 52.2 mL/min (A) (by C-G formula based on SCr of 1.79 mg/dL (H)). Liver Function Tests: Recent Labs  Lab 06/15/24 2320 06/18/24 0135  AST 14* 272*  ALT 11 207*  ALKPHOS 41 72  BILITOT 0.2 0.4  PROT 5.2* 5.5*  ALBUMIN 3.1* 2.9*   No results for input(s): LIPASE, AMYLASE in the last 168 hours. No results for input(s): AMMONIA in the last 168 hours. Coagulation Profile: Recent Labs  Lab 06/16/24 0120  INR 1.2   Cardiac Enzymes: No results for input(s): CKTOTAL, CKMB, CKMBINDEX, TROPONINI in the last 168 hours. BNP (last 3 results) No results for input(s): PROBNP in the last 8760 hours. HbA1C: No results for input(s): HGBA1C in the last 72 hours. CBG: Recent Labs  Lab 06/15/24 2320  GLUCAP 107*   Lipid Profile: No results for input(s): CHOL, HDL, LDLCALC, TRIG, CHOLHDL, LDLDIRECT in the last 72 hours. Thyroid  Function Tests: No results for input(s): TSH, T4TOTAL, FREET4, T3FREE, THYROIDAB in the last 72 hours. Anemia Panel: No results for input(s): VITAMINB12, FOLATE,  FERRITIN, TIBC, IRON, RETICCTPCT in the last 72 hours. Sepsis Labs: Recent Labs  Lab 06/16/24 0827  LATICACIDVEN 1.2    Recent Results (from the past 240 hours)  Blood culture (routine x 2)     Status: None (Preliminary result)   Collection Time: 06/16/24  8:27 AM   Specimen: BLOOD LEFT HAND  Result Value Ref Range Status   Specimen Description   Final    BLOOD LEFT HAND Performed at [facility], [address], [place], [place] [address]    Special Requests   Final    BOTTLES DRAWN AEROBIC AND ANAEROBIC Blood Culture results may not be optimal due to an inadequate volume of blood received in culture bottles Performed at [facility], [address] [place], [place] [address]    Culture   Final    NO GROWTH 2 DAYS Performed at [facility], [address] [place], [place] [address]    Report Status PENDING  Incomplete  Blood culture (routine x 2)     Status: None (Preliminary result)   Collection Time: 06/16/24  8:30 AM   Specimen: BLOOD  Result Value Ref Range Status  Specimen Description   Final    BLOOD BLOOD RIGHT WRIST Performed at [facility], [address], [place], [place] [address]    Special Requests   Final    BOTTLES DRAWN AEROBIC AND ANAEROBIC Blood Culture results may not be optimal due to an inadequate volume of blood received in culture bottles Performed at [facility], [address], [place], [place] [address]    Culture   Final    NO GROWTH 2 DAYS Performed at [facility], [address] [place], [place] [address]    Report Status PENDING  Incomplete  Culture, blood (Routine X 2) w Reflex to ID Panel     Status: None (Preliminary result)   Collection Time: 06/16/24 10:10 AM   Specimen: BLOOD  Result Value Ref Range Status   Specimen Description   Final    BLOOD OTHER PLATELET UNIT T7600 [id number] D Performed at [facility], [address] [place], [place] [address]    Special Requests   Final    BOTTLES DRAWN AEROBIC AND ANAEROBIC Blood Culture results may not be optimal due to an inadequate volume of blood received in culture bottles Performed at [facility], [address] [place], [place] [address]    Culture   Final    NO GROWTH 2 DAYS Performed at [facility], [address], [place], [place] [address]    Report Status PENDING  Incomplete  MRSA Next Gen by PCR, Nasal     Status: None   Collection Time: 06/16/24 12:05 PM   Specimen: Nasal Mucosa; Nasal Swab  Result Value Ref Range Status   MRSA by PCR Next Gen NOT DETECTED NOT DETECTED Final    Comment: (NOTE) The GeneXpert MRSA Assay (FDA approved for NASAL specimens only), is one component of a comprehensive MRSA colonization surveillance program. It is not intended to diagnose MRSA infection nor to guide or monitor treatment for MRSA infections. Test performance is not FDA approved in patients less than 36 years old. Performed at [facility], [address], [place], [place] [address]          Radiology Studies: No results found.      Scheduled Meds:  sodium chloride    Intravenous Once   atorvastatin   40 mg Oral Daily   benztropine   1 mg Oral BID   Chlorhexidine  Gluconate Cloth  6 each Topical Daily   divalproex   500 mg Oral QHS   filgrastim  (NIVESTYM ) SQ  480 mcg Subcutaneous q1800   levothyroxine   25 mcg Oral Daily   oxymetazoline   1 spray Each Nare BID   pantoprazole   40 mg Oral Daily   risperiDONE   1 mg Oral QHS   sodium chloride  flush  10-40 mL Intracatheter Q12H   Continuous Infusions:  ceFEPime  (MAXIPIME ) IV Stopped (06/18/24 0931)          Sophie Mao, MD [facility] 06/18/2024, 10:56 AM

## 2024-06-19 DIAGNOSIS — D61818 Other pancytopenia: Secondary | ICD-10-CM | POA: Diagnosis not present

## 2024-06-19 LAB — CBC WITH DIFFERENTIAL/PLATELET
Abs Immature Granulocytes: 0.01 K/uL (ref 0.00–0.07)
Basophils Absolute: 0 K/uL (ref 0.0–0.1)
Basophils Relative: 0 %
Eosinophils Absolute: 0 K/uL (ref 0.0–0.5)
Eosinophils Relative: 1 %
HCT: 22.2 % — ABNORMAL LOW (ref 39.0–52.0)
Hemoglobin: 7.9 g/dL — ABNORMAL LOW (ref 13.0–17.0)
Immature Granulocytes: 1 %
Lymphocytes Relative: 37 %
Lymphs Abs: 0.4 K/uL — ABNORMAL LOW (ref 0.7–4.0)
MCH: 32.8 pg (ref 26.0–34.0)
MCHC: 35.6 g/dL (ref 30.0–36.0)
MCV: 92.1 fL (ref 80.0–100.0)
Monocytes Absolute: 0.1 K/uL (ref 0.1–1.0)
Monocytes Relative: 9 %
Neutro Abs: 0.6 K/uL — ABNORMAL LOW (ref 1.7–7.7)
Neutrophils Relative %: 52 %
Platelets: 38 K/uL — ABNORMAL LOW (ref 150–400)
RBC: 2.41 MIL/uL — ABNORMAL LOW (ref 4.22–5.81)
RDW: 15.7 % — ABNORMAL HIGH (ref 11.5–15.5)
Smear Review: NORMAL
WBC: 1.1 K/uL — CL (ref 4.0–10.5)
nRBC: 6.5 % — ABNORMAL HIGH (ref 0.0–0.2)

## 2024-06-19 LAB — TYPE AND SCREEN
ABO/RH(D): A POS
Antibody Screen: NEGATIVE
Unit division: 0
Unit division: 0
Unit division: 0
Unit division: 0
Unit division: 0

## 2024-06-19 LAB — BPAM PLATELET PHERESIS
Blood Product Expiration Date: 202512072359
Blood Product Expiration Date: 202512062359
ISSUE DATE / TIME: 202512050628
ISSUE DATE / TIME: 202512051455
Unit Type and Rh: 6200
Unit Type and Rh: 6200

## 2024-06-19 LAB — BPAM RBC
Blood Product Expiration Date: 202512282359
Blood Product Expiration Date: 202512282359
Blood Product Expiration Date: 202512282359
Blood Product Expiration Date: 202512282359
Blood Product Unit Number: 202512282359
ISSUE DATE / TIME: 202512050238
ISSUE DATE / TIME: 202512051214
ISSUE DATE / TIME: 202512052223
ISSUE DATE / TIME: 202512061232
PRODUCT CODE: 202512062002
PRODUCT CODE: 202512282359
Unit Type and Rh: 6200
Unit Type and Rh: 202512282359
Unit Type and Rh: 6200
Unit Type and Rh: 6200
Unit Type and Rh: 6200
Unit Type and Rh: 6200
Unit Type and Rh: 6200

## 2024-06-19 LAB — PREPARE PLATELET PHERESIS
Unit division: 0
Unit division: 0

## 2024-06-19 LAB — COMPREHENSIVE METABOLIC PANEL WITH GFR
ALT: 280 U/L — ABNORMAL HIGH (ref 0–44)
AST: 190 U/L — ABNORMAL HIGH (ref 15–41)
Albumin: 3 g/dL — ABNORMAL LOW (ref 3.5–5.0)
Alkaline Phosphatase: 81 U/L (ref 38–126)
Anion gap: 11 (ref 5–15)
BUN: 40 mg/dL — ABNORMAL HIGH (ref 6–20)
CO2: 20 mmol/L — ABNORMAL LOW (ref 22–32)
Calcium: 9.3 mg/dL (ref 8.9–10.3)
Chloride: 104 mmol/L (ref 98–111)
Creatinine, Ser: 1.65 mg/dL — ABNORMAL HIGH (ref 0.61–1.24)
GFR, Estimated: 50 mL/min — ABNORMAL LOW (ref >60)
Glucose, Bld: 91 mg/dL (ref 70–99)
Potassium: 3.4 mmol/L — ABNORMAL LOW (ref 3.5–5.1)
Sodium: 135 mmol/L (ref 135–145)
Total Bilirubin: 0.7 mg/dL (ref 0.0–1.2)
Total Protein: 6.2 g/dL — ABNORMAL LOW (ref 6.5–8.1)

## 2024-06-19 LAB — MAGNESIUM: Magnesium: 1.8 mg/dL (ref 1.7–2.4)

## 2024-06-19 MED ORDER — METOPROLOL TARTRATE 25 MG PO TABS
25.0000 mg | ORAL_TABLET | Freq: Two times a day (BID) | ORAL | Status: DC
  Administered 2024-06-19 – 2024-06-20 (×3): 25 mg via ORAL
  Filled 2024-06-19 (×3): qty 1

## 2024-06-19 MED ORDER — HALOPERIDOL LACTATE 5 MG/ML IJ SOLN
2.0000 mg | Freq: Four times a day (QID) | INTRAMUSCULAR | Status: DC | PRN
  Administered 2024-06-19: 2 mg via INTRAVENOUS
  Filled 2024-06-19: qty 1

## 2024-06-19 MED ORDER — ORAL CARE MOUTH RINSE: 15.0000 mL | OROMUCOSAL | Status: DC | PRN

## 2024-06-19 NOTE — Progress Notes (Signed)
 PROGRESS NOTE    [name]  FMW:[id number] DOB: April 24, 1971 DOA: 06/15/2024 PCP: Patient, No Pcp Per   Brief Narrative:  53 y.o. 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  On presentation, he was found to be pancytopenic.  CT of the head and neck did not show any acute findings.  Patient was transfused packed red cells and platelets.  He had some fever after transfusion for which she was given Solu-Medrol  and Benadryl .  ER provider tried to transfer patient to [facility] but they were unable to accept due to capacity.  Oncology and ENT were consulted.  Assessment & Plan:   Epistaxis in the setting of nasopharyngeal carcinoma Acute blood loss anemia - Nosebleeding has resolved.  ENT evaluation appreciated: Recommended no specific intervention and if bleeding resumes significantly, patient will need IR embolization. -Has required 5 units packed red cells and 2 units platelet transfusion so far during this hospitalization.  Had transfusion reaction and needed premedication with Tylenol , Benadryl  and Solu-Medrol . - Monitor H&H.  Hemoglobin pending this morning.  Pancytopenia Nasopharyngeal carcinoma - Currently being managed at [facility] with immunotherapy and chemotherapy for the last chemotherapy with gemcitabine  on 06/06/2024.  Will need to follow-up with [facility] as an outpatient for further treatment. ER provider tried to transfer patient to [facility] but they were unable to accept due to capacity. - Oncology/Dr. Timmy following.  Patient has had 5 units packed red cells and 2 units platelet transfusion so far during this hospitalization.  Currently getting filgrastim  as per oncology.  Continue to monitor CBC daily.  AKI on CKD stage IIIa - Patient creatinine of 1.6.  Creatinine 2.06 on presentation.   Improving to 1.79 on 06/18/2024.  Labs pending today.  Treated with IV fluids and subsequently discontinued.  Monitor  Essential hypertension - Blood pressure still extremely elevated.  Use as needed hydralazine .  Losartan  on hold.  Amlodipine  started on 06/19/2023.  Start metoprolol  for bradycardia.  Hypokalemia - Improved  Hyponatremia -Improved  Elevated LFTs - Questionable cause.  AST and ALT significantly elevated on 06/18/2024 without elevation of alk phos and bili total.  DC'd statin on 06/18/2024.  Labs pending today.  Repeat a.m. LFTs.  Hypothyroidism -continue levothyroxine   History of schizophrenia - Continue Depakote , risperidone  and benztropine .  Outpatient follow-up with psychiatry    DVT prophylaxis: SCDs Code Status: Full Family Communication: None at bedside Disposition Plan: Status is: Inpatient Remains inpatient appropriate because: Of severity of illness    Consultants: Oncology/ENT  Procedures: None  Antimicrobials: None   Subjective: Patient seen and examined at bedside.  Denies any worsening chest pain or shortness of breath or vomiting.  Still feels weak. Objective: Vitals:   06/19/24 0330 06/19/24 0400 06/19/24 0500 06/19/24 0600  BP:  (!) 144/98 (!) 149/93 (!) 152/104  Pulse:  86 91 81  Resp:  (!) 23 (!) 23 15  Temp: 98.6 F (37 C)     TempSrc: Oral     SpO2:  93% 96% 94%  Weight:   80.5 kg   Height:        Intake/Output Summary (Last 24 hours) at 06/19/2024 0727 Last data filed at 06/19/2024 0600 Gross per 24 hour  Intake 397.58 ml  Output 1950 ml  Net -1552.42 ml   Filed Weights   06/16/24 1333 06/19/24 0500  Weight: 75.6 kg 80.5 kg    Examination:  General: Currently on room air.  No distress ENT/neck: No thyromegaly.  JVD is not elevated  respiratory: Decreased breath sounds at bases bilaterally with some crackles; no wheezing  CVS: S1-S2 heard, rate controlled currently Abdominal: Soft, nontender, slightly distended; no  organomegaly, bowel sounds are heard Extremities: Trace lower extremity edema; no cyanosis  CNS: Awake and alert.  Remains slow to respond and a poor historian.  No focal neurologic deficit.  Moves extremities Lymph: No obvious lymphadenopathy Skin: No obvious ecchymosis/lesions  psych: Mostly flat affect.  Currently not agitated. musculoskeletal: No obvious joint swelling/deformity    Data Reviewed: I have personally reviewed following labs and imaging studies  CBC: Recent Labs  Lab 06/15/24 2320 06/16/24 0827 06/16/24 1949 06/17/24 0145 06/17/24 1700 06/18/24 0135 06/18/24 1756  WBC 0.4* 0.1* 0.1* 0.1*  --  0.2*  --   NEUTROABS  --  0.1*  --   --   --   --   --   HGB 4.6* 5.3* 5.5* 6.1* 6.6* 7.1* 7.5*  HCT 13.2* 15.0* 15.6* 16.6* 18.6* 19.7* 21.0*  MCV 96.4 94.9 90.2 93.8  --  91.2  --   PLT <5* 12* 27* 24*  --  17*  --    Basic Metabolic Panel: Recent Labs  Lab 06/15/24 2320 06/17/24 0145 06/18/24 0135  NA 131* 136 136  K 4.0 3.3* 3.6  CL 97* 103 104  CO2 24 24 22   GLUCOSE 97 94 150*  BUN 55* 53* 47*  CREATININE 2.26* 1.87* 1.79*  CALCIUM  7.9* 8.4* 8.6*   GFR: Estimated Creatinine Clearance: 55.2 mL/min (A) (by C-G formula based on SCr of 1.79 mg/dL (H)). Liver Function Tests: Recent Labs  Lab 06/15/24 2320 06/18/24 0135  AST 14* 272*  ALT 11 207*  ALKPHOS 41 72  BILITOT 0.2 0.4  PROT 5.2* 5.5*  ALBUMIN 3.1* 2.9*   No results for input(s): LIPASE, AMYLASE in the last 168 hours. No results for input(s): AMMONIA in the last 168 hours. Coagulation Profile: Recent Labs  Lab 06/16/24 0120  INR 1.2   Cardiac Enzymes: No results for input(s): CKTOTAL, CKMB, CKMBINDEX, TROPONINI in the last 168 hours. BNP (last 3 results) No results for input(s): PROBNP in the last 8760 hours. HbA1C: No results for input(s): HGBA1C in the last 72 hours. CBG: Recent Labs  Lab 06/15/24 2320  GLUCAP 107*   Lipid Profile: No results for input(s):  CHOL, HDL, LDLCALC, TRIG, CHOLHDL, LDLDIRECT in the last 72 hours. Thyroid  Function Tests: No results for input(s): TSH, T4TOTAL, FREET4, T3FREE, THYROIDAB in the last 72 hours. Anemia Panel: No results for input(s): VITAMINB12, FOLATE, FERRITIN, TIBC, IRON, RETICCTPCT in the last 72 hours. Sepsis Labs: Recent Labs  Lab 06/16/24 0827  LATICACIDVEN 1.2    Recent Results (from the past 240 hours)  Blood culture (routine x 2)     Status: None (Preliminary result)   Collection Time: 06/16/24  8:27 AM   Specimen: BLOOD LEFT HAND  Result Value Ref Range Status   Specimen Description   Final    BLOOD LEFT HAND Performed at [facility], [address] [place], [place] [address]    Special Requests   Final    BOTTLES DRAWN AEROBIC AND ANAEROBIC Blood Culture results may not be optimal due to an inadequate volume of blood received in culture bottles Performed at [facility], [address] [place], [place] [address]    Culture   Final    NO GROWTH  2 DAYS Performed at [facility], [address], [place], [place] [address]    Report Status PENDING  Incomplete  Blood culture (routine x 2)     Status: None (Preliminary result)   Collection Time: 06/16/24  8:30 AM   Specimen: BLOOD  Result Value Ref Range Status   Specimen Description   Final    BLOOD BLOOD RIGHT WRIST Performed at [facility], [address], [place], [place] [address]    Special Requests   Final    BOTTLES DRAWN AEROBIC AND ANAEROBIC Blood Culture results may not be optimal due to an inadequate volume of blood received in culture bottles Performed at [facility], [address], [place], [place] [address]    Culture   Final    NO GROWTH 2 DAYS Performed at [facility], [address], [place], [place] [address]    Report Status PENDING  Incomplete  Culture, blood (Routine X 2) w Reflex to ID Panel      Status: None (Preliminary result)   Collection Time: 06/16/24 10:10 AM   Specimen: BLOOD  Result Value Ref Range Status   Specimen Description   Final    BLOOD OTHER PLATELET UNIT T7600 [id number] D Performed at [facility], [address] [place], [place] [address]    Special Requests   Final    BOTTLES DRAWN AEROBIC AND ANAEROBIC Blood Culture results may not be optimal due to an inadequate volume of blood received in culture bottles Performed at [facility], [address], [place], [place] [address]    Culture   Final    NO GROWTH 2 DAYS Performed at [facility], [address], [place], [place] [address]    Report Status PENDING  Incomplete  MRSA Next Gen by PCR, Nasal     Status: None   Collection Time: 06/16/24 12:05 PM   Specimen: Nasal Mucosa; Nasal Swab  Result Value Ref Range Status   MRSA by PCR Next Gen NOT DETECTED NOT DETECTED Final    Comment: (NOTE) The GeneXpert MRSA Assay (FDA approved for NASAL specimens only), is one component of a comprehensive MRSA colonization surveillance program. It is not intended to diagnose MRSA infection nor to guide or monitor treatment for MRSA infections. Test performance is not FDA approved in patients less than 58 years old. Performed at [facility], [address] [place], [place] [address]          Radiology Studies: No results found.      Scheduled Meds:  sodium chloride    Intravenous Once   amLODipine   10 mg Oral Daily   benztropine   1 mg Oral BID   Chlorhexidine  Gluconate Cloth  6 each Topical Daily   divalproex   500 mg Oral QHS   filgrastim  (NIVESTYM ) SQ  480 mcg Subcutaneous q1800   levothyroxine   25 mcg Oral Daily   oxymetazoline   1 spray Each Nare BID   pantoprazole   40 mg Oral Daily   risperiDONE   1 mg Oral QHS   sodium chloride  flush  10-40 mL Intracatheter Q12H   Continuous Infusions:  ceFEPime  (MAXIPIME ) IV Stopped (06/18/24  2324)          Sophie Mao, MD [facility] 06/19/2024, 7:27 AM

## 2024-06-19 NOTE — Progress Notes (Signed)
   06/19/24 1656  TOC Brief Assessment  Insurance and Status Reviewed  Patient has primary care physician  (PCP list added to AVS)  Home environment has been reviewed Apartment  Prior level of function: Independent with ADL's  Prior/Current Home Services No current home services  Social Drivers of Health Review SDOH reviewed no interventions necessary  Readmission risk has been reviewed Yes  Transition of care needs transition of care needs identified, TOC will continue to follow   Pt screened. Pt has no PCP listed in chart. PCP list added to AVS. IP CM will follow for any additional DC planning needs.

## 2024-06-19 NOTE — Discharge Instructions (Signed)
 PCP List  Almarie EMERSON Bolds, MD Specialty: Internal Medicine  [facility] [address] [place], [place] [address] Main Phone: [phone]   Rosaline Johnie Bohr, NP Specialty: Internal Medicine, Nurse Practitioner  [facility] [address] [place], [place] [address] Main Phone: [phone]  [facility]   Evalene Hadassah Munch, MD Specialty: Internal Medicine  [facility] for Infectious Disease [address] [place], [place] [address] Main Phone: [phone]   Ardell Manly, MD Specialty: Internal Medicine  [facility] Internal Medicine @ [place] [address] [place] [address] [place], [place] [address] Main Phone: [phone]   Annalee Orem, MD Specialty: Infectious Disease, Internal Medicine  [facility] for Infectious Disease [address] [place], [place] [address] Main Phone: [phone]  [facility]   Obadiah Birmingham, MD Specialty: Endocrinology, Internal Medicine  [facility] Endocrinology [address] [place], [place] [address] Main Phone: [phone]  [facility]   Tanda Bame, MD Specialty: Internal Medicine  [facility] Internal Medicine @ [place] [address] [place] [address] [place], [place] [address] Main Phone: [phone]   Donell Stefano DOROTHA Sam, MD Specialty: Endocrinology, Internal Medicine  [facility] Endocrinology [address] [place], [place] [address] Main Phone: [phone]  [facility]   Sudan Thapa, MD Specialty: Endocrinology, Internal Medicine  [facility] Endocrinology [address] [place], [place] [address] Main Phone: [phone]  [facility]   Constance Murtis Passer, MD Specialty: Infectious Disease, Internal Medicine  [facility] for Infectious  Disease [address] [place], [place] [address] Main Phone: [phone]  [facility]   Jone Fredrica Dauphin, MD Specialty: Internal Medicine  [facility] Center [address] [place], [place] [address] Main Phone: [phone]   Dayton Sherlean Eastern, DO Specialty: Internal Medicine  [facility] Center [address] [place], [place] [address] Main Phone: [phone]  [facility]   Ronnald Sergeant, MD Specialty: Internal Medicine  [facility] [address] [place], [place] [address] Main Phone: [phone]  [facility]   Mliss Anner Foot, MD Specialty: Internal Medicine  [facility] Center [address] [place], [place] [address] Main Phone: [phone]  [facility]   Mliss LABOR. Trudy, MD Specialty: Internal Medicine  [facility] Internal Medicine Center [address] [place], [place] [address] Main Phone: [phone]   Elsie Penne Savannah, MD Specialty: Internal Medicine  [facility] Internal Medicine Center [address] [place], [place] [address] Main Phone: [phone]  [facility]   Rumaldo Johnie, FNP-C Specialty: Internal Medicine, Urgent Care  [facility] Urgent Care at [facility] [address] [place], [place] [address] Main Phone: [phone]  [facility] Medical [facility]   Ethel Hosey Aguas, [place] Specialty: Internal Medicine  [facility] Urgent Care at [facility] [address] [place], [place] [address] Main Phone: [phone]  [facility]   Barnie Rojelio Louder, MD Specialty: Internal Medicine  [facility] [address] [place], [place] [address] Main Phone: [phone]  [facility]   Lonia Serve, MD Specialty: Hospice and Palliative Care, Internal Medicine  [facility]  [facility] [address] [place], [place] [address] Main Phone: [phone]  [facility] Palliative Care [address] [place], [place] [address] Main Phone: [phone]  [facility]   Josseline Loretta Fell, [place] Specialty: Internal Medicine  [facility] [address] [place], [place] [address] Main Phone: [phone]  [facility]iative Care [address] [place], [place] [address] Main Phone: [phone]  [facility]   Orlan Richardson Cramp, DO Specialty: Allergy-Immunology, Internal Medicine  [place] [facility] [place] [facility] 1427[address] [facility]. [address] [place], [place] [address] Main Phone: [phone]  [facility] [place] at [facility] [place]. [address] [place], [place] [address] Main Phone: [phone]  [facility]   Hargis Adolphus Springer, MD Specialty: Allergy-Immunology, Internal Medicine  [facility] [address] [place] [place], [place] [address] Main Phone: [phone]  [facility] [place] at [facility] [place]. [address] [place], [place] [address] Main Phone: [phone]  [facility] [place] at [facility] [address] Surgery [facility]. [address] [place] [address] Main Phone: [phone]  [facility]   Priya Marysue Blanch, MD Specialty: Internal Medicine  [place] [place] at [facility] [place]. [address] [place], [place] [address] Main Phone: [phone]  [facility] [place] at [facility] [address] [place], [place] [address] Main Phone: [phone]  [facility] Medical [facility]   Manus BRAVO. Melvenia, MD Specialty: Internal Medicine  [facility] [address] [place], [place]  [address] Main Phone: [phone]  [facility]   Alban Pepper, MD Specialty: Hospitalist, Internal Medicine  [facility] [address] [place], [place] [address] Main Phone: [phone]  [facility]   Camellia Door, DO Specialty: Hospitalist, Internal Medicine  [facility] & Adult Medicine [address] [place], [place] [address] Main Phone: [phone]  [facility] [address] [place], [place] [address] Main Phone: [phone]  [facility]   Will ALONSO Bernard, MD Specialty: Internal Medicine  [facility] [address] [place], [place] [address] Main Phone: [phone]  [facility]   Greig Free, DO Specialty: Hospitalist, Internal Medicine  [facility] [address] [place], [place] [address] Main Phone: [phone]  [facility]   Benton JULIANNA Moats, NP Specialty: Internal Medicine, Nurse Practitioner  [facility] [address] [place], [place] [address] Main Phone: [phone]  [facility]   [facility] Linda Homans, MD Specialty: Internal Medicine  [facility] [address] [place], [place] [address] Main Phone: [phone]  [facility]   Landon Senna Dibia, MD Specialty: Internal Medicine  [facility] [address] [place], [place] [address] Main Phone: [phone]   Rockie Corine Rams, NP Specialty: Internal Medicine  [facility] [address] [place], [place] [address] Main Phone: [phone]  [facility]   [facility] Marcy Fulling, DO Specialty: Internal Medicine  [facility] [address] [place], [place] [address] Main Phone: [phone]   Justus Brigid Poor, MD Specialty: Internal  Medicine  [facility] [address]  [place], [place] [address] Main Phone: [phone]   Elfreda Hildegard Bathe, MD Specialty: Internal Medicine, Pulmonary Medicine  [facility] [address] [place], [place] [address] Main Phone: [phone]  [facility] [place] [address] [place], [place] [address] Main Phone: [phone]   Stephane Earnie Palin, NP Specialty: Internal Medicine  [facility] [address] [place], [place] [address] Main Phone: [phone]  [facility] Health Medical Group   Jackolyn Nichole Chute, NP Specialty: Internal Medicine  [facility] [address] [place], [place] [address] Main Phone: [phone]   Marylee Scurry, DO Specialty: Hospitalist, Internal Medicine  [facility] [address] [place], [place] [address] Main Phone: [phone]  [facility] Health Medical Group   Alm Fairy Heads, MD Specialty: [facility] Medicine, Internal Medicine  [facility] [address] [place], [place] [address] Main Phone: [phone]   Laymon Kathyleen Pinal, NP Specialty: Internal Medicine  [facility] [address] [place], [place] [address] Main Phone: [phone]  [facility] Medical Group   Mir Emery Velinda Roger, MD Specialty: Internal Medicine  [facility] [address] [place], [place] [address] Main Phone: [phone]   Erica Heddy Lia, MD Specialty: Internal Medicine  [facility] [address] [place], [place] [address] Main Phone: [phone]   Paticia Mercy Blanch, MD Specialty: Internal Medicine  [facility] [address] [place], [place] [address] Main Phone: [phone]   Ellouise Hollow Trenda Ra, MD Specialty: Internal Medicine  [facility] [address] [place], [place] [address] Main Phone:  [phone]  [facility]   Tawni Tilton Saint, MD Specialty: Hospitalist, Internal Medicine  [facility] [address] [place], [place] [address] Main Phone: [phone]   Atlee Abernethy, MD Specialty: Internal Medicine  [facility] [address] [place], [place] [address] Main Phone: [phone]   Massie Rosalva Sewer, [place] Specialty: Internal Medicine  [facility] [address] [place], [place] [address] Main Phone: [phone]  [facility] [address] [place], [place] [address] Main Phone: [phone]  [facility] Medical [facility]   Glendora Grayce Sharps, NP Specialty: Internal Medicine  [facility] [address] [place], [place] [address] Main Phone: [phone]   Concepcion Riser, MD Specialty: Hospitalist, Internal Medicine  [facility] [address] [place], [place] [address] Main Phone: [phone]  [facility]   Lamarr Jama Gunner, FNP-BC Specialty: Internal Medicine, Nurse Practitioner  [facility] [address] [place], [place] [address] Main Phone: [phone]  [facility] Palliative Care [address] [place], [place] [address] Main Phone: [phone]  [facility] Health Medical Group   Micaela Speaker, MD Specialty: Hospitalist, Internal Medicine  [facility] [address] [place], [place] [address] Main Phone: [phone]  [facility]   Camila DELENA Ned, MD Specialty: Internal Medicine  [facility] [address] [place], [place] [address] Main Phone: [phone]   [place] Nichole Ada, [facility] Specialty: Internal Medicine  [facility] [address] [place], [place] [address] Main Phone: [phone]  [facility]   Bruna Creighton, FNP-C Specialty:  Internal Medicine  [facility] [address] [place] [address] [place], [place] [address] Main Phone: [phone]  [facility]   Catheryn SAILOR. Jarold,  MD Specialty: Internal Medicine  [facility] [address] [place] [address] [place], [place] [address] Main Phone: [phone]  [facility]   Harlene Venson An, NP Specialty: Geriatrics, Internal Medicine  [facility] & Adult Medicine [address] [place], [place] [address] Main Phone: [phone]  [facility]   Amy Almarie Cluster, NP Specialty: Geriatrics, Internal Medicine  [facility] & Adult Medicine [address] [place], [place] [address] Main Phone: [phone]  [facility]   Fredia Merita Bring, MD Specialty: Geriatrics, Internal Medicine  [facility] Adult Medicine [address] [place], [place] [address] Main Phone: [phone]  [facility]   Elsie Ferdinand Tish MOULD, MD Specialty: Internal Medicine  [facility] & Adult Medicine [address] [place], [place] [address] Main Phone: [phone]  [facility]   Dinah JAYSON Plough, FNP-C, NP Specialty: Internal Medicine  [facility] & Adult Medicine [address] [place], [place] [address] Main Phone: [phone]  [facility] Health Medical Group   Ronal Norleen Hailstone, MD Specialty: Internal Medicine  Ronal Norleen Hailstone, MD [address] [place], [place] [address] Main Phone: [phone]  [facility]   Carlin Tresea Griffon, [place] Specialty: Internal Medicine  [facility] Imaging at [facility] [address] [place] [address] [place], [place] [address] Main Phone: [phone]   Lonni Elsie Ester, MD Specialty: Internal Medicine,  Rheumatology  [facility] Rheumatology [address] [place], [place] [address] Main Phone: [phone]  [facility]   Asberry Claw, DO Specialty: Internal Medicine, Rheumatology  [facility] Rheumatology [address] [place], [place] [address] Main Phone: [phone]  [facility] Health Medical Group   Odella Corene Pepper, [place] Specialty: Internal Medicine  [facility] [address] [place], [place] [address] Main Phone: [phone]   Claudeen Katrina Land, NP Specialty: Internal Medicine, Nurse Practitioner  [facility] [address] [place], [place] [address] Main Phone: [phone]   Ephriam Showman, MD Specialty: [facility] & Palliative Medicine, Internal Medicine  [facility] [address] [place], [place] [address] Main Phone: [phone]   Lucas Tory Parker, MD Specialty: Internal Medicine  [facility] Health Healthy Weight & Wellness at [facility] [address] [place]. [place], [place] [address] Main Phone: [phone]  [facility] Health Medical Group   Lonell Liverpool, ANP-C Specialty: Internal Medicine, Nurse Practitioner  [facility] Health Healthy Weight & Wellness at [facility] [address] [place]. [place], [place] [address] Main Phone: [phone]  [facility] Medical Group   Edwin A. Shelda, MD Specialty: Internal Medicine  [facility] [address] [place], [place] [address] Main Phone: [phone]   Richard A. Shepard, MD Specialty: Internal Medicine  [facility] [address] [place], [place] [address] Main Phone: [phone]   Ravisankar R. Janey, MD Specialty: Internal Medicine  [facility] [address] [place], [place] [address] Main Phone: [phone]   Glendia Ezzard Freeman, MD Specialty: Internal Medicine  [facility] [address] [place], [place] [address] Main Phone: [phone]   Toribio KANDICE Shan, MD Specialty: Internal Medicine  [facility] [address] [place], [place] [address] Main Phone: [phone]   Oneil DELENA Neth, MD Specialty: Internal Medicine  [facility] [address] [place], [place] [address] Main Phone: [phone]   Norleen EMERSON Jungling, MD Specialty: Internal Medicine  [facility] [address] [place], [place] [address] Main Phone: [phone]   [facility] `Doug` Loreli Raddle., MD Specialty: Internal Medicine  [facility] [address] [place], [place] [address] Main Phone: [phone]   Garnette DELENA Ore, MD Specialty: Endocrinology, Internal Medicine  [facility] [address] [place], [place] [address] Main Phone: [phone]   Leita Pearlene Blind, MD Specialty: Internal Medicine  [facility] [address] [place], [place] [address] Main Phone: [phone]   Evalene Ozell Lanes, DO Specialty: Internal Medicine  [facility] [address] [place], [place] [address] Main Phone: [phone]   Lequita Flor, MD Specialty: Internal Medicine  [facility] [address] [place], [place] [address] Main Phone: [phone]   Mac Chloe Bolster, NP Specialty: Internal Medicine, Nurse Practitioner  [facility] [address] [place], [place] [address] Main Phone: [phone]  [place] [facility]   Glade DOROTHA Hope, MD Specialty: Internal Medicine  [facility] at [facility] [address] [place], [place] [address] Main Phone: [phone]  [facility]   Almarie A. Rollene, MD Specialty: Internal Medicine  [facility] at [facility] [address] [place], [place] [address] Main Phone: [phone]  [facility]  Health Medical Group   Rosario BROCKS. Federico, MD Specialty: Gastroenterology, Internal Medicine  [facility] Gastroenterology [address] [place] 3rd Floor [place], [place] [address] Main Phone: [phone]  [facility] Health Medical Group   Lauraine FORBES Pereyra, NP Specialty: Geriatrics, Internal Medicine  [facility] [address] [place], [place] [address] Main Phone: [phone]  [place] [facility]   Boby LITTIE Mackintosh, NP Specialty: Internal Medicine  [facility] HealthCare at [facility] [address] [place], [place] [address] Main Phone: [phone]  [facility] Health Medical Group   Lynwood MICAEL Rush, MD Specialty: Internal Medicine  [facility] [address] [place], [place] [address] Main Phone: [phone]  [facility] Health Medical Group   Debby LITTIE Molt, MD Specialty: Internal Medicine  [facility] [address] [place], [place] [address] Main Phone: [phone]  [facility] Health Medical Group   Corean Morgan Ku, FNP-C Specialty: Internal Medicine, Nurse Practitioner  [facility] at [facility] [address] [place], [place] [address] Main Phone: [phone]  [facility] Medical Group   Karlynn GAILS. Plotnikov, MD Specialty: Internal Medicine  [facility] [address] [place], [place] [address] Main Phone: [phone]  [facility]   [place]. Purcell, MD Specialty: Internal Medicine  [facility] [address] [place], [place] [address] Main Phone: [phone]  [facility] Health Medical Group   Emery Hedy Fuss, MD Specialty: Internal Medicine  [facility] [address] [place] Jew[place], [place] [address] Main Phone: [phone]  [facility] Health Medical Group   Harlene Gretel Eastern,  [place] Specialty: Internal Medicine  [facility] Wound [facility] at [facility] [address] [place] [address] [place], [place] [address] Main Phone: [phone]  [facility] at [facility] [address] [place], [place] [address] Main Phone: [phone]  [facility]   Bertram CHRISTELLA Bald, NP Specialty: Internal Medicine, Nurse Practitioner  [facility] [address] Jew[place], [place] [address] Main Phone: [phone]  [facility]   Bascom Claudene Borer, [place] Specialty: Internal Medicine, Nurse Practitioner  [facility] [address] [place]na Jewell [place], [place] [address] Main Phone: [phone]  [facility]   Ozell JUDITHANN Basket, MD Specialty: Internal Medicine  [facility] Wound Care & [facility] at [facility] [address] [place], [place] [address] Main Phone: [phone]  [facility] at [facility] [address], [address] [place], [place] [address] Main Phone: [phone]   Powell Linn Lessen, [place] Specialty: Internal Medicine, Nurse Practitioner  [facility] at [facility] [address] [place], [place] [address] Main Phone: [phone]  [facility]   Almarie Bedford, MD Specialty: Hematology-Oncology, Internal Medicine  [facility] at [facility] [address] [place], [place] [address] Main Phone: [phone]  [facility]   Lenny Drought, MD Specialty: Internal Medicine  [facility] at [facility] [address] [place], [place] [address] Main Phone: [phone]   Greig Drones, NP Specialty: Internal Medicine  [facility] at [facility] [address] [place], [place] [address] Main Phone: [phone]

## 2024-06-19 NOTE — Progress Notes (Addendum)
 [name]   DOB:[birth year]   FM#:[id number]      ASSESSMENT & PLAN:  [name]. [name] is a 53 year old male patient admitted on 06/16/2024 with complaints of nosebleed.  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  [facility] following due to pancytopenia.    Pancytopenia, severe -- Admitted on 06/15/24 with severe pancytopenia status post chemo/immunotherapy at [name]. -- WBC on admission was 400. Hemoglobin 4.6. Platelet count <5K.   Leukopenia -- WBC improved from 1.1 today. ANC remains low 0.6 -- Continue GCS-F as ordered, until ANC >1500 Anemia -- Hemoglobin improved to 7.9 -- Transfuse PRBC for HGB <7.0 Thrombocytopenia -- Platelets improved to 38K -- Transfuse platelets for counts <20K or <50K with bleeding -- Monitor CBC with differential  Nasopharyngeal cancer -- Initially diagnosed March 2023 -- Treated by his Primary Oncology team at [name] with concurrent chemotherapy plus radiation therapy.   -- Unfortunately, patient has had progression of disease with changes to oncologic therapy since diagnosis. Initially given carbo+gem, then weekly cisplatin . This was switched to carbo+taxol+Keytruda .   -- More recently he has been treated with cisplatin +gem+toripalimab x6 cycles.  Last received 06/06/24.   -- Okay to transfer to [name] as bed is available there.  -- Medical Oncology/Dr. Timmy following for this hospitalization.  Epistaxis -- Epistaxis likely due to low counts.  -- Counts continue to improve. -- Seen by ENT, no intervention planned.   Transaminitis -- Elevated LFTs noted -- Avoid hepatotoxic agents. Noted statin discontinued.  -- Monitor hepatic function  Renal dysfunction Hypertension -- Elevated creatinine and BUN.   -- Avoid nephrotoxic agents. Continue gentle hydration.  Code Status Full   Subjective:  Patient seen awake and alert laying in bed.  States that he has back pain since last night  Objective:   Intake/Output Summary (Last 24 hours) at 06/19/2024 1138 Last data filed at 06/19/2024 1100 Gross per 24 hour  Intake 686.81 ml  Output 1950 ml  Net -1263.19 ml     PHYSICAL EXAMINATION: ECOG PERFORMANCE STATUS: 2 - Symptomatic, <50% confined to bed  Vitals:   06/19/24 1000 06/19/24 1100  BP: (!) 163/101 (!) 143/106  Pulse: 97 99  Resp: 19 17  Temp:    SpO2: 94% 96%   Filed Weights   06/16/24 1333 06/19/24 0500  Weight: 166 lb 10.7 oz (75.6 kg) 177 lb 7.5 oz (80.5 kg)    GENERAL: alert, +back pain +ill-appearing +right eye ptosis SKIN: skin color, texture, turgor are normal, no rashes or significant lesions EYES: normal, conjunctiva are pink and non-injected, sclera clear OROPHARYNX: no exudate, no erythema and lips, buccal mucosa, and tongue normal  NECK: supple, thyroid  normal size, non-tender, without nodularity LYMPH: no palpable lymphadenopathy in the cervical, axillary or inguinal LUNGS: clear to auscultation and percussion with normal breathing effort HEART: regular rate & rhythm and no murmurs and no lower extremity edema ABDOMEN: abdomen soft, non-tender and normal bowel sounds MUSCULOSKELETAL: no cyanosis of digits and no clubbing  PSYCH: alert & oriented x 3 with fluent speech NEURO: no focal motor/sensory deficits   All questions were answered. The patient knows to call the clinic with any problems, questions or concerns.   The total time spent in the appointment was 40 minutes encounter with patient including review of chart and various tests results, discussions about plan of care and coordination of care plan  Olam JINNY Brunner, NP 06/19/2024 11:38 AM  Labs Reviewed:  Lab Results  Component Value Date   WBC 1.1 (LL) 06/19/2024   HGB 7.9 (L) 06/19/2024   HCT 22.2 (L) 06/19/2024   MCV 92.1 06/19/2024   PLT 38 (L) 06/19/2024   Recent Labs    06/15/24 2320 06/17/24 0145 06/18/24 0135  06/19/24 0509  NA 131* 136 136 135  K 4.0 3.3* 3.6 3.4*  CL 97* 103 104 104  CO2 24 24 22  20*  GLUCOSE 97 94 150* 91  BUN 55* 53* 47* 40*  CREATININE 2.26* 1.87* 1.79* 1.65*  CALCIUM  7.9* 8.4* 8.6* 9.3  GFRNONAA 34* 43* 45* 50*  PROT 5.2*  --  5.5* 6.2*  ALBUMIN 3.1*  --  2.9* 3.0*  AST 14*  --  272* 190*  ALT 11  --  207* 280*  ALKPHOS 41  --  72 81  BILITOT 0.2  --  0.4 0.7    Studies Reviewed:   DG Chest Portable 1 View Result Date: 06/16/2024 CLINICAL DATA:  SOB EXAM: DG CHEST 1V PORT COMPARISON:  04/01/2022 FINDINGS: Right chest port in place terminating at the cavoatrial junction. No focal airspace consolidation, pleural effusion, or pneumothorax. No cardiomegaly. No acute fracture or destructive lesions. Multilevel thoracic osteophytosis. IMPRESSION: No acute cardiopulmonary abnormality. Electronically Signed   By: Rogelia Myers M.D.   On: 06/16/2024 08:56   CT Head W or Wo Contrast Result Date: 06/16/2024 EXAM: CT HEAD WITHOUT AND WITH 06/16/2024 12:46:58 AM TECHNIQUE: CT of the head was performed without and with the administration of 50 mL of iohexol  (OMNIPAQUE ) 300 MG/ML solution. Automated exposure control, iterative reconstruction, and/or weight based adjustment of the mA/kV was utilized to reduce the radiation dose to as low as reasonably achievable. COMPARISON: None available. CLINICAL HISTORY: Brain metastases suspected. FINDINGS: BRAIN AND VENTRICLES: No acute intracranial hemorrhage. No mass effect or midline shift. No extra-axial fluid collection. No evidence of acute infarct. No hydrocephalus. There is extensive bony destruction of the clivus with the right cavernous sinus and sella. ORBITS: Orbits are unremarkable. SINUSES AND MASTOIDS: Extensive mucosal thickening and fluid opacification of the visualized paranasal sinuses. Fluid opacification of the left middle ear cavity and mastoid air cells. Right middle ear cavity and mastoid air cells are clear. SOFT TISSUES AND  SKULL: No acute skull fracture. No acute soft tissue abnormality. IMPRESSION: 1. Extensive bony destruction of the clivus with involvement of the right cavernous sinus and sella. 2. Extensive mucosal thickening and fluid opacification of the visualized paranasal sinuses. 3. Fluid opacification of the left middle ear cavity and mastoid air cells. Electronically signed by: Dorethia Molt MD 06/16/2024 01:19 AM EST RP Workstation: [id number]   CT Maxillofacial W Contrast Result Date: 06/16/2024 EXAM: CT Face With contrast 06/16/2024 12:46:58 AM TECHNIQUE: CT of the face was performed with the administration of intravenous contrast. 50 mL iohexol  (OMNIPAQUE ) 300 MG/ML solution was administered. Multiplanar reformatted images are provided for review. Automated exposure control, iterative reconstruction, and/or weight based adjustment of the mA/kV was utilized to reduce the radiation dose to as low as reasonably achievable. COMPARISON: None available CLINICAL HISTORY: epistaxis; h/o nasopharyngeal cancer FINDINGS: AERODIGESTIVE TRACT: Frothy secretions are seen within the nasopharynx and posteriorly within the oropharynx and hypopharynx also representing blood products. Extensive soft tissue is seen anterior to the clivus corresponding to no malignancy, however, this is not well delineated in the absence of contrast administration. No mass. No edema. SALIVARY GLANDS: No acute abnormality. LYMPH NODES: No suspicious cervical lymphadenopathy. SOFT TISSUES: Extensive soft  tissue is seen anterior to the clivus corresponding to no malignancy, however, this is not well delineated in the absence of contrast administration. No mass or fluid collection. BRAIN, ORBITS AND SINUSES: Visualized intracranial contents are unremarkable. Orbits are unremarkable. Extensive permeative destruction of the clivus related to the patient's known nasopharyngeal carcinoma. Erosive changes involve the posterior walls of the sphenoid sinuses  bilaterally and possibly involves the right cavernous sinus (48/13, 63/9). There is extensive mucosal thickening throughout the paranasal sinuses. High attenuation fluid is seen within the left maxillary sinus, likely representing blood products. The left medial pterygoid plate is eroded. Fluid opacification of the left mastoid air cells and middle ear cavity. BONES: No acute facial fracture identified. No mandibular dislocation. Extensive permeative destruction of the clivus related to the patient's known nasopharyngeal carcinoma. Erosive changes involve the posterior walls of the sphenoid sinuses bilaterally and possibly involves the right cavernous sinus (48/13, 63/9). The left medial pterygoid plate is eroded. IMPRESSION: 1. Extensive permeative destruction of the clivus related to nasopharyngeal carcinoma, with associated soft tissue anterior to the clivus (not well delineated without contrast), erosive changes involving the posterior walls of the sphenoid sinuses bilaterally with possible right cavernous sinus involvement, and erosion of the left medial pterygoid plate. 2. High attenuation fluid in the left maxillary sinus likely representing blood products. 3. Fluid opacification of the left middle ear and mastoid air cells. Electronically signed by: Dorethia Molt MD 06/16/2024 01:07 AM EST RP Workstation: [id number]   CT Cervical Spine Wo Contrast Result Date: 06/16/2024 EXAM: CT CERVICAL SPINE WITHOUT CONTRAST 06/16/2024 12:46:58 AM TECHNIQUE: CT of the cervical spine was performed without the administration of intravenous contrast. Multiplanar reformatted images are provided for review. Automated exposure control, iterative reconstruction, and/or weight based adjustment of the mA/kV was utilized to reduce the radiation dose to as low as reasonably achievable. COMPARISON: None available. CLINICAL HISTORY: recurring falls; h/o schizo ->inconsistent story FINDINGS: CERVICAL SPINE: BONES AND ALIGNMENT:  There is extensive permeative destruction of the clivus itself. Craniocervical alignment is normal. No acute fracture of the cervical spine. Vertebral body height is preserved. DEGENERATIVE CHANGES: Endplate changes at C5-C7 is present in keeping with changes of minimal degenerative disc disease. No high-grade canal stenosis. No high-grade neural foraminal narrowing. SOFT TISSUES: Preclival soft tissue thickening at the base of the skull, incompletely included on this examination. IMPRESSION: 1. Extensive permeative destruction of the clivus with associated preclival soft tissue thickening, incompletely included on this examination. This corresponds to the patient's known nasopharyngeal carcinoma that is seen on MRI examination of 12/04/2023 but is not well assessed on this noncontrast examination. 2. No acute fracture of the cervical spine. Electronically signed by: Dorethia Molt MD 06/16/2024 12:57 AM EST RP Workstation: [id number]    ADDENDUM: It is nice to see his blood counts finally started to come up.  His white count really is come up nicely so starting Neupogen .  His white cell count is now 1.1.  Hemoglobin 7.9 platelet count up to 38,000.  His LFTs are improving a little bit.  Hopefully, this is just an effect from his past chemotherapy.  His BUN and creatinine are also improving.  I do not see a reason to transfuse him.  Again his hemoglobin is trending upward very nicely.  Hopefully, we will continue to see his blood counts improved.  I will continue him on the Neupogen  for right now.  I would like to see his neutrophil count above 1500 before I would stop the Neupogen .  I would expect that his LFTs will also improve.  As always, it is apparent he is getting incredible care from everybody in the ICU.   Jeralyn Crease, MD  [place] 5:2

## 2024-06-20 ENCOUNTER — Other Ambulatory Visit (HOSPITAL_COMMUNITY): Payer: Self-pay

## 2024-06-20 LAB — COMPREHENSIVE METABOLIC PANEL WITH GFR
ALT: 187 U/L — ABNORMAL HIGH (ref 0–44)
AST: 62 U/L — ABNORMAL HIGH (ref 15–41)
Albumin: 2.9 g/dL — ABNORMAL LOW (ref 3.5–5.0)
Alkaline Phosphatase: 83 U/L (ref 38–126)
Anion gap: 9 (ref 5–15)
BUN: 32 mg/dL — ABNORMAL HIGH (ref 6–20)
CO2: 26 mmol/L (ref 22–32)
Calcium: 9.5 mg/dL (ref 8.9–10.3)
Chloride: 101 mmol/L (ref 98–111)
Creatinine, Ser: 1.64 mg/dL — ABNORMAL HIGH (ref 0.61–1.24)
GFR, Estimated: 50 mL/min — ABNORMAL LOW (ref >60)
Glucose, Bld: 90 mg/dL (ref 70–99)
Potassium: 3.6 mmol/L (ref 3.5–5.1)
Sodium: 135 mmol/L (ref 135–145)
Total Bilirubin: 0.6 mg/dL (ref 0.0–1.2)
Total Protein: 5.7 g/dL — ABNORMAL LOW (ref 6.5–8.1)

## 2024-06-20 LAB — CBC WITH DIFFERENTIAL/PLATELET
Abs Immature Granulocytes: 0.4 K/uL — ABNORMAL HIGH (ref 0.00–0.07)
Band Neutrophils: 20 %
Basophils Absolute: 0 K/uL (ref 0.0–0.1)
Basophils Relative: 1 %
Eosinophils Absolute: 0 K/uL (ref 0.0–0.5)
Eosinophils Relative: 0 %
HCT: 21.6 % — ABNORMAL LOW (ref 39.0–52.0)
Hemoglobin: 7.7 g/dL — ABNORMAL LOW (ref 13.0–17.0)
Lymphocytes Relative: 9 %
Lymphs Abs: 0.4 K/uL — ABNORMAL LOW (ref 0.7–4.0)
MCH: 33.2 pg (ref 26.0–34.0)
MCHC: 35.6 g/dL (ref 30.0–36.0)
MCV: 93.1 fL (ref 80.0–100.0)
Metamyelocytes Relative: 8 %
Monocytes Absolute: 0.2 K/uL (ref 0.1–1.0)
Monocytes Relative: 4 %
Myelocytes: 2 %
Neutro Abs: 3.3 K/uL (ref 1.7–7.7)
Neutrophils Relative %: 56 %
Platelets: 55 K/uL — ABNORMAL LOW (ref 150–400)
RBC: 2.32 MIL/uL — ABNORMAL LOW (ref 4.22–5.81)
RDW: 16.2 % — ABNORMAL HIGH (ref 11.5–15.5)
Smear Review: NORMAL
WBC: 4.3 K/uL (ref 4.0–10.5)
nRBC: 3.9 % — ABNORMAL HIGH (ref 0.0–0.2)

## 2024-06-20 LAB — HEPATITIS PANEL, ACUTE
HCV Ab: NONREACTIVE
Hep A IgM: NONREACTIVE
Hep B C IgM: NONREACTIVE
Hepatitis B Surface Ag: NONREACTIVE

## 2024-06-20 LAB — MAGNESIUM: Magnesium: 1.5 mg/dL — ABNORMAL LOW (ref 1.7–2.4)

## 2024-06-20 MED ORDER — HEPARIN SOD (PORK) LOCK FLUSH 100 UNIT/ML IV SOLN
500.0000 [IU] | INTRAVENOUS | Status: AC | PRN
  Administered 2024-06-20: 500 [IU]

## 2024-06-20 MED ORDER — OMEPRAZOLE 20 MG PO CPDR: 20.0000 mg | DELAYED_RELEASE_CAPSULE | Freq: Every day | ORAL | Status: AC | PRN

## 2024-06-20 MED ORDER — MAGNESIUM SULFATE 2 GM/50ML IV SOLN
2.0000 g | Freq: Once | INTRAVENOUS | Status: AC
  Administered 2024-06-20: 2 g via INTRAVENOUS
  Filled 2024-06-20: qty 50

## 2024-06-20 MED ORDER — CETIRIZINE HCL 10 MG PO TABS: 10.0000 mg | ORAL_TABLET | Freq: Every day | ORAL | Status: AC | PRN

## 2024-06-20 MED ORDER — AMLODIPINE BESYLATE 10 MG PO TABS
10.0000 mg | ORAL_TABLET | Freq: Every day | ORAL | 0 refills | Status: AC
  Filled 2024-06-20: qty 30, 30d supply, fill #0

## 2024-06-20 MED ORDER — ATORVASTATIN CALCIUM 40 MG PO TABS: 40.0000 mg | ORAL_TABLET | Freq: Every day | ORAL | Status: DC

## 2024-06-20 MED ORDER — METOPROLOL TARTRATE 25 MG PO TABS
25.0000 mg | ORAL_TABLET | Freq: Two times a day (BID) | ORAL | 0 refills | Status: AC
  Filled 2024-06-20: qty 60, 30d supply, fill #0

## 2024-06-20 MED ORDER — DIPHENHYDRAMINE HCL 25 MG PO TABS: 25.0000 mg | ORAL_TABLET | Freq: Every day | ORAL | Status: AC

## 2024-06-20 NOTE — Discharge Summary (Signed)
 Physician Discharge Summary  JAYLENE SCHROM FMW:[id number] DOB: Nov 20, 1970 DOA: 06/15/2024  PCP: Patient, No Pcp Per  Admit date: 06/15/2024 Discharge date: 06/20/2024  Admitted From: Home Disposition: Home  Recommendations for Outpatient Follow-up:  Follow up with PCP in 1 week with repeat CBC/BMP Outpatient follow-up with oncology and ENT Follow up in ED if symptoms worsen or new appear   Home Health: No Equipment/Devices: None  Discharge Condition: Stable CODE STATUS: Full Diet recommendation: Heart healthy  Brief/Interim Summary: 53 y.o. 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  On presentation, he was found to be pancytopenic.  CT of the head and neck did not show any acute findings.  Patient was transfused packed red cells and platelets.  He had some fever after transfusion for which she was given Solu-Medrol  and Benadryl .  ER provider tried to transfer patient to [facility] but they were unable to accept due to capacity.  Oncology and ENT were consulted.  He was managed conservatively.  Subsequently, his condition has improved.  Epistaxis has resolved.  He required multiple packed red cells and platelet transfusions during this hospitalization.  He was also treated with filgrastim  by oncology.  Today, he is not neutropenic anymore and hemoglobin and platelets are stable.  Oncology has cleared the patient for discharge.  Discharge patient home today.  Outpatient follow-up with PCP/oncology/ENT.  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  ENT evaluation appreciated: Recommended no specific intervention and if bleeding resumes significantly, patient will need IR embolization. -Has required 5 units packed  red cells and 2 units platelet transfusion so far during this hospitalization.  Had transfusion reaction and needed premedication with Tylenol , Benadryl  and Solu-Medrol . - Hemoglobin stable this morning.  Outpatient follow-up of hemoglobin.  Outpatient follow-up with ENT if needed.   Pancytopenia Nasopharyngeal carcinoma - Currently being managed at [facility] with immunotherapy and chemotherapy for the last chemotherapy with gemcitabine  on 06/06/2024.  Will need to follow-up with [facility] as an outpatient for further treatment. ER provider tried to transfer patient to [facility] but they were unable to accept due to capacity. - Oncology/Dr. Timmy following.  Patient has had 5 units packed red cells and 2 units platelet transfusion so far during this hospitalization.  Currently getting filgrastim  as per oncology.   -Today, he is not neutropenic anymore and hemoglobin and platelets are stable.  Oncology has cleared the patient for discharge.  Discharge patient home today.  Outpatient follow-up with PCP/oncology   AKI on CKD stage IIIa - Patient creatinine of 1.6.  Creatinine 2.06 on presentation.  Improving to 1.69 today.  Treated with IV fluids and subsequently discontinued.  Outpatient follow-up of BMP.  In   Essential hypertension - Blood pressure maintained elevated. Amlodipine  started on 06/19/2023: Continue on discharge.  Started on 06/19/2024: Continue on discharge.  Losartan  could not remain on hold because of AKI till reevaluation with PCP.   Hypokalemia - Improved   Hyponatremia -Improved  Hypomagnesemia - Replace.  Outpatient follow-up   Elevated LFTs - Questionable cause.  AST and ALT are improving without elevation of alk phos and bili total.  DC'd statin on 06/18/2024.  LFTs improving.  Follow LFTs as an outpatient.  Continue to hold statin on discharge.   Hypothyroidism -continue levothyroxine    History of schizophrenia - Continue Depakote , risperidone  and  benztropine .  Outpatient  follow-up with psychiatry     Discharge Instructions  Discharge Instructions     Diet - low sodium heart healthy   Complete by: As directed    Increase activity slowly   Complete by: As directed       Allergies as of 06/20/2024   No Known Allergies      Medication List     STOP taking these medications    atorvastatin  40 MG tablet Commonly known as: LIPITOR   losartan  100 MG tablet Commonly known as: COZAAR    losartan  50 MG tablet Commonly known as: COZAAR    thyroid  30 MG tablet Commonly known as: ARMOUR       TAKE these medications    amLODipine  10 MG tablet Commonly known as: NORVASC  Take 1 tablet (10 mg total) by mouth daily. Start taking on: June 21, 2024   benztropine  1 MG tablet Commonly known as: COGENTIN  Take 1 mg by mouth 2 (two) times daily.   cetirizine  10 MG tablet Commonly known as: ZyrTEC  Allergy Take 1 tablet (10 mg total) by mouth daily as needed for allergies.   dexamethasone  4 MG tablet Commonly known as: DECADRON  Take 2 tablets (8 mg) by mouth daily in the morning for 3 days following cisplatin  chemotherapy.   diphenhydrAMINE  25 MG tablet Commonly known as: BENADRYL  Take 1 tablet (25 mg total) by mouth daily.   divalproex  500 MG 24 hr tablet Commonly known as: DEPAKOTE  ER Take 1 tablet (500 mg total) by mouth at bedtime.   fluticasone  50 MCG/ACT nasal spray Commonly known as: FLONASE  Place 1 spray into both nostrils daily as needed. What changed: when to take this   folic acid  1 MG tablet Commonly known as: FOLVITE  Take 1 tablet (1 mg total) by mouth daily.   levothyroxine  25 MCG tablet Commonly known as: SYNTHROID  Take 1 tablet (25 mcg total) by mouth daily. Take on an empty stomach with a glass of water at least 30-60 minutes before breakfast. What changed: additional instructions   loperamide  2 MG tablet Commonly known as: IMODIUM  A-D Take 2 tablets (4 mg total) by mouth at first diarrhea, then one tablet  after each additional diarrhea. No more than 8 tablets per day. What changed:  how much to take when to take this   loratadine  10 MG tablet Commonly known as: CLARITIN  Take 1 tablet (10 mg total) by mouth daily.   magnesium  oxide 400 MG tablet Commonly known as: MAG-OX Take 1 tablet (400 mg total) by mouth 2 (two) times daily.   metoprolol  tartrate 25 MG tablet Commonly known as: LOPRESSOR  Take 1 tablet (25 mg total) by mouth 2 (two) times daily.   naproxen  sodium 220 MG tablet Commonly known as: Aleve  Take 1 tablet (220 mg total) by mouth 2 (two) times daily as needed. What changed:  when to take this reasons to take this   omeprazole  20 MG capsule Commonly known as: PRILOSEC Take 1 capsule (20 mg total) by mouth daily as needed (reflux).   ondansetron  8 MG tablet Commonly known as: ZOFRAN  Take 1 tablet (8 mg total) by mouth every 8 (eight) hours as needed for nausea/vomiting. What changed:  when to take this reasons to take this   Potassium Chloride  ER 20 MEQ Tbcr Take 1 tablet (20 mEq total) by mouth 2 (two) times daily. What changed: Another medication with the same name was removed. Continue taking this medication, and follow the directions you see here.   RisperDAL  Consta  50 MG injection Generic drug: risperiDONE  microspheres Inject 50 mg into the muscle every 14 (fourteen) days.   risperiDONE  1 MG tablet Commonly known as: RISPERDAL  Take 1 mg by mouth at bedtime.   rizatriptan  10 MG disintegrating tablet Commonly known as: MAXALT -MLT Take 1 tablet (10 mg total) by mouth as needed for migraine. May repeat in 2 hours if needed          Follow-up Information     PCP. Schedule an appointment as soon as possible for a visit in 1 week(s).                 No Known Allergies  Consultations: Oncology/ENT   Procedures/Studies: DG Chest Portable 1 View Result Date: 06/16/2024 CLINICAL DATA:  SOB EXAM: DG CHEST 1V PORT COMPARISON:  04/01/2022  FINDINGS: Right chest port in place terminating at the cavoatrial junction. No focal airspace consolidation, pleural effusion, or pneumothorax. No cardiomegaly. No acute fracture or destructive lesions. Multilevel thoracic osteophytosis. IMPRESSION: No acute cardiopulmonary abnormality. Electronically Signed   By: Rogelia Myers M.D.   On: 06/16/2024 08:56   CT Head W or Wo Contrast Result Date: 06/16/2024 EXAM: CT HEAD WITHOUT AND WITH 06/16/2024 12:46:58 AM TECHNIQUE: CT of the head was performed without and with the administration of 50 mL of iohexol  (OMNIPAQUE ) 300 MG/ML solution. Automated exposure control, iterative reconstruction, and/or weight based adjustment of the mA/kV was utilized to reduce the radiation dose to as low as reasonably achievable. COMPARISON: None available. CLINICAL HISTORY: Brain metastases suspected. FINDINGS: BRAIN AND VENTRICLES: No acute intracranial hemorrhage. No mass effect or midline shift. No extra-axial fluid collection. No evidence of acute infarct. No hydrocephalus. There is extensive bony destruction of the clivus with the right cavernous sinus and sella. ORBITS: Orbits are unremarkable. SINUSES AND MASTOIDS: Extensive mucosal thickening and fluid opacification of the visualized paranasal sinuses. Fluid opacification of the left middle ear cavity and mastoid air cells. Right middle ear cavity and mastoid air cells are clear. SOFT TISSUES AND SKULL: No acute skull fracture. No acute soft tissue abnormality. IMPRESSION: 1. Extensive bony destruction of the clivus with involvement of the right cavernous sinus and sella. 2. Extensive mucosal thickening and fluid opacification of the visualized paranasal sinuses. 3. Fluid opacification of the left middle ear cavity and mastoid air cells. Electronically signed by: Dorethia Molt MD 06/16/2024 01:19 AM EST RP Workstation: [id number]   CT Maxillofacial W Contrast Result Date: 06/16/2024 EXAM: CT Face With contrast 06/16/2024  12:46:58 AM TECHNIQUE: CT of the face was performed with the administration of intravenous contrast. 50 mL iohexol  (OMNIPAQUE ) 300 MG/ML solution was administered. Multiplanar reformatted images are provided for review. Automated exposure control, iterative reconstruction, and/or weight based adjustment of the mA/kV was utilized to reduce the radiation dose to as low as reasonably achievable. COMPARISON: None available CLINICAL HISTORY: epistaxis; h/o nasopharyngeal cancer FINDINGS: AERODIGESTIVE TRACT: Frothy secretions are seen within the nasopharynx and posteriorly within the oropharynx and hypopharynx also representing blood products. Extensive soft tissue is seen anterior to the clivus corresponding to no malignancy, however, this is not well delineated in the absence of contrast administration. No mass. No edema. SALIVARY GLANDS: No acute abnormality. LYMPH NODES: No suspicious cervical lymphadenopathy. SOFT TISSUES: Extensive soft tissue is seen anterior to the clivus corresponding to no malignancy, however, this is not well delineated in the absence of contrast administration. No mass or fluid collection. BRAIN, ORBITS AND SINUSES: Visualized intracranial contents are unremarkable. Orbits are unremarkable. Extensive permeative destruction of  the clivus related to the patient's known nasopharyngeal carcinoma. Erosive changes involve the posterior walls of the sphenoid sinuses bilaterally and possibly involves the right cavernous sinus (48/13, 63/9). There is extensive mucosal thickening throughout the paranasal sinuses. High attenuation fluid is seen within the left maxillary sinus, likely representing blood products. The left medial pterygoid plate is eroded. Fluid opacification of the left mastoid air cells and middle ear cavity. BONES: No acute facial fracture identified. No mandibular dislocation. Extensive permeative destruction of the clivus related to the patient's known nasopharyngeal carcinoma.  Erosive changes involve the posterior walls of the sphenoid sinuses bilaterally and possibly involves the right cavernous sinus (48/13, 63/9). The left medial pterygoid plate is eroded. IMPRESSION: 1. Extensive permeative destruction of the clivus related to nasopharyngeal carcinoma, with associated soft tissue anterior to the clivus (not well delineated without contrast), erosive changes involving the posterior walls of the sphenoid sinuses bilaterally with possible right cavernous sinus involvement, and erosion of the left medial pterygoid plate. 2. High attenuation fluid in the left maxillary sinus likely representing blood products. 3. Fluid opacification of the left middle ear and mastoid air cells. Electronically signed by: Dorethia Molt MD 06/16/2024 01:07 AM EST RP Workstation: [id number]   CT Cervical Spine Wo Contrast Result Date: 06/16/2024 EXAM: CT CERVICAL SPINE WITHOUT CONTRAST 06/16/2024 12:46:58 AM TECHNIQUE: CT of the cervical spine was performed without the administration of intravenous contrast. Multiplanar reformatted images are provided for review. Automated exposure control, iterative reconstruction, and/or weight based adjustment of the mA/kV was utilized to reduce the radiation dose to as low as reasonably achievable. COMPARISON: None available. CLINICAL HISTORY: recurring falls; h/o schizo ->inconsistent story FINDINGS: CERVICAL SPINE: BONES AND ALIGNMENT: There is extensive permeative destruction of the clivus itself. Craniocervical alignment is normal. No acute fracture of the cervical spine. Vertebral body height is preserved. DEGENERATIVE CHANGES: Endplate changes at C5-C7 is present in keeping with changes of minimal degenerative disc disease. No high-grade canal stenosis. No high-grade neural foraminal narrowing. SOFT TISSUES: Preclival soft tissue thickening at the base of the skull, incompletely included on this examination. IMPRESSION: 1. Extensive permeative destruction of the  clivus with associated preclival soft tissue thickening, incompletely included on this examination. This corresponds to the patient's known nasopharyngeal carcinoma that is seen on MRI examination of 12/04/2023 but is not well assessed on this noncontrast examination. 2. No acute fracture of the cervical spine. Electronically signed by: Dorethia Molt MD 06/16/2024 12:57 AM EST RP Workstation: [id number]      Subjective: Patient seen and examined at bedside.  Poor historian.  No epistaxis, seizures, fever reported.  Discharge Exam: Vitals:   06/20/24 0700 06/20/24 0800  BP: 135/81 130/86  Pulse: (!) 103 96  Resp: 15 15  Temp:  99.5 F (37.5 C)  SpO2: (!) 89% 93%    General: Pt is alert, awake, not in acute distress.  Chronically ill and deconditioned looking.  Slow to respond.  Poor historian. Cardiovascular: Intermittently tachycardic, S1/S2 + Respiratory: bilateral decreased breath sounds at bases with scattered crackles Abdominal: Soft, NT, ND, bowel sounds + Extremities: no edema, no cyanosis    The results of significant diagnostics from this hospitalization (including imaging, microbiology, ancillary and laboratory) are listed below for reference.     Microbiology: Recent Results (from the past 240 hours)  Blood culture (routine x 2)     Status: None (Preliminary result)   Collection Time: 06/16/24  8:27 AM   Specimen: BLOOD LEFT HAND  Result Value Ref Range  Status   Specimen Description   Final    BLOOD LEFT HAND Performed at [facility], [address], [place], [place] [address]    Special Requests   Final    BOTTLES DRAWN AEROBIC AND ANAEROBIC Blood Culture results may not be optimal due to an inadequate volume of blood received in culture bottles Performed at [facility], [address] [place], [place] [address]    Culture   Final    NO GROWTH 4 DAYS Performed at [facility], [address], [place], [place] [address]     Report Status PENDING  Incomplete  Blood culture (routine x 2)     Status: None (Preliminary result)   Collection Time: 06/16/24  8:30 AM   Specimen: BLOOD  Result Value Ref Range Status   Specimen Description   Final    BLOOD BLOOD RIGHT WRIST Performed at [facility], [address], [place], [place] [address]    Special Requests   Final    BOTTLES DRAWN AEROBIC AND ANAEROBIC Blood Culture results may not be optimal due to an inadequate volume of blood received in culture bottles Performed at [facility], [address], [place], [place] [address]    Culture   Final    NO GROWTH 4 DAYS Performed at [facility], [address], [place], [place] [address]    Report Status PENDING  Incomplete  Culture, blood (Routine X 2) w Reflex to ID Panel     Status: None (Preliminary result)   Collection Time: 06/16/24 10:10 AM   Specimen: BLOOD  Result Value Ref Range Status   Specimen Description   Final    BLOOD OTHER PLATELET UNIT T7600 [id number] D Performed at [facility], [address], [place], [place] [address]    Special Requests   Final    BOTTLES DRAWN AEROBIC AND ANAEROBIC Blood Culture results may not be optimal due to an inadequate volume of blood received in culture bottles Performed at [facility], [address], [place], [place] [address]    Culture   Final    NO GROWTH 4 DAYS Performed at [facility], [address], [place], [place] [address]    Report Status PENDING  Incomplete  MRSA Next Gen by PCR, Nasal     Status: None   Collection Time: 06/16/24 12:05 PM   Specimen: Nasal Mucosa; Nasal Swab  Result Value Ref Range Status   MRSA by PCR Next Gen NOT DETECTED NOT DETECTED Final    Comment: (NOTE) The GeneXpert MRSA Assay (FDA approved for NASAL specimens only), is one component of a comprehensive MRSA colonization surveillance program. It is not intended to diagnose MRSA  infection nor to guide or monitor treatment for MRSA infections. Test performance is not FDA approved in patients less than 87 years old. Performed at [facility], [address] [place], [place] [address]      Labs: BNP (last 3 results) No results for input(s): BNP in the last 8760 hours. Basic Metabolic Panel: Recent Labs  Lab 06/15/24 2320 06/17/24 0145 06/18/24 0135 06/19/24 0509 06/20/24 0518  NA 131* 136 136 135 135  K 4.0 3.3* 3.6 3.4* 3.6  CL 97* 103 104 104 101  CO2 24 24 22  20* 26  GLUCOSE 97 94 150* 91 90  BUN 55* 53* 47* 40* 32*  CREATININE 2.26* 1.87* 1.79* 1.65* 1.64*  CALCIUM  7.9* 8.4* 8.6* 9.3 9.5  MG  --   --   --  1.8 1.5*   Liver Function Tests: Recent Labs  Lab 06/15/24 2320 06/18/24 0135 06/19/24 0509 06/20/24 0518  AST 14* 272* 190* 62*  ALT 11 207* 280* 187*  ALKPHOS 41 72 81 83  BILITOT 0.2 0.4 0.7 0.6  PROT 5.2* 5.5* 6.2* 5.7*  ALBUMIN 3.1* 2.9* 3.0* 2.9*   No results for input(s): LIPASE, AMYLASE in the last 168 hours. No results for input(s): AMMONIA in the last 168 hours. CBC: Recent Labs  Lab 06/16/24 0827 06/16/24 1949 06/17/24 0145 06/17/24 1700 06/18/24 0135 06/18/24 1756 06/19/24 0509 06/20/24 0518  WBC 0.1* 0.1* 0.1*  --  0.2*  --  1.1* 4.3  NEUTROABS 0.1*  --   --   --   --   --  0.6* 3.3  HGB 5.3* 5.5* 6.1* 6.6* 7.1* 7.5* 7.9* 7.7*  HCT 15.0* 15.6* 16.6* 18.6* 19.7* 21.0* 22.2* 21.6*  MCV 94.9 90.2 93.8  --  91.2  --  92.1 93.1  PLT 12* 27* 24*  --  17*  --  38* 55*   Cardiac Enzymes: No results for input(s): CKTOTAL, CKMB, CKMBINDEX, TROPONINI in the last 168 hours. BNP: Invalid input(s): POCBNP CBG: Recent Labs  Lab 06/15/24 2320  GLUCAP 107*   D-Dimer No results for input(s): DDIMER in the last 72 hours. Hgb A1c No results for input(s): HGBA1C in the last 72 hours. Lipid Profile No results for input(s): CHOL, HDL, LDLCALC, TRIG, CHOLHDL, LDLDIRECT  in the last 72 hours. Thyroid  function studies No results for input(s): TSH, T4TOTAL, T3FREE, THYROIDAB in the last 72 hours.  Invalid input(s): FREET3 Anemia work up No results for input(s): VITAMINB12, FOLATE, FERRITIN, TIBC, IRON, RETICCTPCT in the last 72 hours. Urinalysis    Component Value Date/Time   COLORURINE STRAW (A) 06/17/2024 0206   APPEARANCEUR CLEAR 06/17/2024 0206   LABSPEC 1.015 06/17/2024 0206   PHURINE 6.0 06/17/2024 0206   GLUCOSEU NEGATIVE 06/17/2024 0206   HGBUR SMALL (A) 06/17/2024 0206   BILIRUBINUR NEGATIVE 06/17/2024 0206   KETONESUR NEGATIVE 06/17/2024 0206   PROTEINUR NEGATIVE 06/17/2024 0206   UROBILINOGEN 0.2 05/05/2011 1642   NITRITE NEGATIVE 06/17/2024 0206   LEUKOCYTESUR NEGATIVE 06/17/2024 0206   Sepsis Labs Recent Labs  Lab 06/17/24 0145 06/18/24 0135 06/19/24 0509 06/20/24 0518  WBC 0.1* 0.2* 1.1* 4.3   Microbiology Recent Results (from the past 240 hours)  Blood culture (routine x 2)     Status: None (Preliminary result)   Collection Time: 06/16/24  8:27 AM   Specimen: BLOOD LEFT HAND  Result Value Ref Range Status   Specimen Description   Final    BLOOD LEFT HAND Performed at [facility], [address] [place], [place] [address]    Special Requests   Final    BOTTLES DRAWN AEROBIC AND ANAEROBIC Blood Culture results may not be optimal due to an inadequate volume of blood received in culture bottles Performed at [facility], [address], [place], [place] [address]    Culture   Final    NO GROWTH 4 DAYS Performed at [facility], [address], [place], [place] [address]    Report Status PENDING  Incomplete  Blood culture (routine x 2)     Status: None (Preliminary result)   Collection Time: 06/16/24  8:30 AM   Specimen: BLOOD  Result Value Ref Range Status   Specimen  Description   Final    BLOOD BLOOD RIGHT WRIST Performed at [facility], [address], [place], [place] [address]    Special Requests   Final    BOTTLES DRAWN AEROBIC AND ANAEROBIC Blood Culture results may not be optimal due to an inadequate volume of blood received in culture bottles Performed at [facility], [address], [place], [place] [address]    Culture   Final    NO GROWTH 4 DAYS Performed at [facility], [address] [place], [place] [address]    Report Status PENDING  Incomplete  Culture, blood (Routine X 2) w Reflex to ID Panel     Status: None (Preliminary result)   Collection Time: 06/16/24 10:10 AM   Specimen: BLOOD  Result Value Ref Range Status   Specimen Description   Final    BLOOD OTHER PLATELET UNIT T7600 [id number] D Performed at [facility], [address] [place], [place] [address]    Special Requests   Final    BOTTLES DRAWN AEROBIC AND ANAEROBIC Blood Culture results may not be optimal due to an inadequate volume of blood received in culture bottles Performed at [facility], [address] [place], [place] [address]    Culture   Final    NO GROWTH 4 DAYS Performed at [facility], [address] [place], [place] [address]    Report Status PENDING  Incomplete  MRSA Next Gen by PCR, Nasal     Status: None   Collection Time: 06/16/24 12:05 PM   Specimen: Nasal Mucosa; Nasal Swab  Result Value Ref Range Status   MRSA by PCR Next Gen NOT DETECTED NOT DETECTED Final    Comment: (NOTE) The GeneXpert MRSA Assay (FDA approved for NASAL specimens only), is one component of a comprehensive MRSA colonization surveillance program. It is not intended to diagnose MRSA infection nor to guide or monitor treatment for MRSA infections. Test performance is not FDA approved in patients less than 36 years old. Performed at [facility], [address] [place], [place] [address]      Time coordinating discharge: 35  minutes  SIGNED:   Sophie Mao, MD  [facility] 06/20/2024, 9:39 AM

## 2024-06-20 NOTE — TOC Transition Note (Signed)
 Transition of Care [facility]) - Discharge Note   Patient Details  Name: [name] MRN: [id number] Date of Birth: 10-Aug-1970  Transition of Care [facility]) CM/SW Contact:  Heather DELENA Saltness, LCSW Phone Number: 06/20/2024, 10:10 AM   Clinical Narrative:    TOC consulted for transportation assistance. Pt discharging home today. Taxi voucher placed in pt's chart at RN station. RN to call taxi when pt ready to discharge. No further TOC needs at this time.   Final next level of care: Home/Self Care Barriers to Discharge: Barriers Resolved   Discharge Placement  Home              Patient to be transferred to facility by: Taxi Name of family member notified: Patient Patient and family notified of of transfer: 06/20/24  Discharge Plan and Services Additional resources added to the After Visit Summary for  Follow Up                Social Drivers of Health (SDOH) Interventions SDOH Screenings   Food Insecurity: No Food Insecurity (06/16/2024)  Housing: Low Risk  (06/16/2024)  Transportation Needs: No Transportation Needs (06/16/2024)  Utilities: Not At Risk (06/16/2024)  Tobacco Use: Medium Risk (06/16/2024)     Readmission Risk Interventions    06/19/2024    4:55 PM  Readmission Risk Prevention Plan  Post Dischage Appt --  Medication Screening Complete  Transportation Screening Complete    Signed: Heather Saltness, MSW, LCSW Clinical Social Worker Inpatient Care Management 06/20/2024 10:12 AM

## 2024-06-20 NOTE — Progress Notes (Signed)
 I am very happy that his blood counts are coming up quite nicely now.  His LFTs are also improving.  Today, his white blood cell count is 4.3.  His hemoglobin 7.7.  Platelet count 55,000.  His SGPT is 187 SGOT 62.  His magnesium  1.5.  BUN 32 creatinine 1.64.  He clearly has gotten through the neutropenia.  Hopefully, he can be moved out of the ICU soon.  All of his cultures have been negative.  I think his antibiotics can be stopped.  It is hard to say how much he is eating.  He has had no nausea or vomiting.  There is been no diarrhea.  He has had no bleeding.  His vital signs are stable.  Temperature 98.9.  Pulse 101.  Blood pressure 125/71.  His head and neck exam shows no mucositis.  He has no oral lesions.  There is no bleeding in the oral cavity.  His lungs are clear bilaterally.  Cardiac exam regular rate and rhythm.  He has no murmurs.  Abdomen is soft.  Bowel sounds are present.  He has no fluid wave.  There is no palpable liver or spleen tip.  Extremity shows no clubbing, cyanosis or edema.  Neurological exam shows no focal neurological deficits.  Again, his white cell count is improving nicely.  Hopefully, his bone marrow is starting to recover and from his chemotherapy.  I would suspect that his blood counts should continue to improve.  Hopefully, he will be able to be moved out of the ICU.  Hopefully he will be able to go home soon.  I know that the staff in the ICU have done a fantastic job with him.  As always, they have such compassion and such professionalism.  Jeralyn Crease, MD  Psalm 34:1

## 2024-06-20 NOTE — Plan of Care (Signed)
  Problem: Clinical Measurements: Goal: Ability to maintain clinical measurements within normal limits will improve Outcome: Progressing   Problem: Nutrition: Goal: Adequate nutrition will be maintained Outcome: Progressing   Problem: Coping: Goal: Level of anxiety will decrease Outcome: Progressing   Problem: Pain Managment: Goal: General experience of comfort will improve and/or be controlled Outcome: Progressing   Problem: Safety: Goal: Ability to remain free from injury will improve Outcome: Progressing

## 2024-06-20 NOTE — Progress Notes (Signed)
 Discharge medications delivered to patient at the bedside.

## 2024-06-21 ENCOUNTER — Other Ambulatory Visit (HOSPITAL_COMMUNITY): Payer: Self-pay

## 2024-06-21 LAB — CULTURE, BLOOD (ROUTINE X 2)
Culture: NO GROWTH
Culture: NO GROWTH
Culture: NO GROWTH

## 2024-06-21 MED ORDER — LOSARTAN POTASSIUM 100 MG PO TABS
100.0000 mg | ORAL_TABLET | Freq: Every morning | ORAL | 3 refills | Status: AC
  Filled 2024-06-21: qty 90, 90d supply, fill #0

## 2024-06-21 MED ORDER — ATORVASTATIN CALCIUM 40 MG PO TABS
40.0000 mg | ORAL_TABLET | Freq: Every day | ORAL | 3 refills | Status: AC
  Filled 2024-06-21: qty 90, 90d supply, fill #0

## 2024-06-23 LAB — TRANSFUSION REACTION
DAT C3: NEGATIVE
Post RXN DAT IgG: NEGATIVE

## 2024-06-28 ENCOUNTER — Emergency Department (HOSPITAL_COMMUNITY)

## 2024-06-28 ENCOUNTER — Emergency Department (HOSPITAL_COMMUNITY)
Admission: EM | Admit: 2024-06-28 | Discharge: 2024-06-28 | Disposition: A | Discharge status: Home / Self Care | Attending: Emergency Medicine | Admitting: Emergency Medicine

## 2024-06-28 ENCOUNTER — Other Ambulatory Visit: Payer: Self-pay

## 2024-06-28 ENCOUNTER — Encounter (HOSPITAL_COMMUNITY): Payer: Self-pay

## 2024-06-28 DIAGNOSIS — Z85818 Personal history of malignant neoplasm of other sites of lip, oral cavity, and pharynx: Secondary | ICD-10-CM | POA: Diagnosis not present

## 2024-06-28 DIAGNOSIS — W19XXXA Unspecified fall, initial encounter: Secondary | ICD-10-CM | POA: Diagnosis not present

## 2024-06-28 DIAGNOSIS — S92352A Displaced fracture of fifth metatarsal bone, left foot, initial encounter for closed fracture: Secondary | ICD-10-CM | POA: Diagnosis not present

## 2024-06-28 DIAGNOSIS — I10 Essential (primary) hypertension: Secondary | ICD-10-CM | POA: Insufficient documentation

## 2024-06-28 DIAGNOSIS — E871 Hypo-osmolality and hyponatremia: Secondary | ICD-10-CM | POA: Insufficient documentation

## 2024-06-28 DIAGNOSIS — M79672 Pain in left foot: Secondary | ICD-10-CM | POA: Diagnosis present

## 2024-06-28 LAB — BASIC METABOLIC PANEL WITH GFR
Anion gap: 10 (ref 5–15)
BUN: 17 mg/dL (ref 6–20)
CO2: 25 mmol/L (ref 22–32)
Calcium: 9.3 mg/dL (ref 8.9–10.3)
Chloride: 98 mmol/L (ref 98–111)
Creatinine, Ser: 2.14 mg/dL — ABNORMAL HIGH (ref 0.61–1.24)
GFR, Estimated: 37 mL/min — ABNORMAL LOW (ref >60)
Glucose, Bld: 192 mg/dL — ABNORMAL HIGH (ref 70–99)
Potassium: 4.6 mmol/L (ref 3.5–5.1)
Sodium: 132 mmol/L — ABNORMAL LOW (ref 135–145)

## 2024-06-28 LAB — CBC WITH DIFFERENTIAL/PLATELET
Abs Immature Granulocytes: 0.08 K/uL — ABNORMAL HIGH (ref 0.00–0.07)
Basophils Absolute: 0 K/uL (ref 0.0–0.1)
Basophils Relative: 0 %
Eosinophils Absolute: 0 K/uL (ref 0.0–0.5)
Eosinophils Relative: 1 %
HCT: 22.9 % — ABNORMAL LOW (ref 39.0–52.0)
Hemoglobin: 7.8 g/dL — ABNORMAL LOW (ref 13.0–17.0)
Immature Granulocytes: 1 %
Lymphocytes Relative: 10 %
Lymphs Abs: 0.7 K/uL (ref 0.7–4.0)
MCH: 34.2 pg — ABNORMAL HIGH (ref 26.0–34.0)
MCHC: 34.1 g/dL (ref 30.0–36.0)
MCV: 100.4 fL — ABNORMAL HIGH (ref 80.0–100.0)
Monocytes Absolute: 0.5 K/uL (ref 0.1–1.0)
Monocytes Relative: 7 %
Neutro Abs: 5.2 K/uL (ref 1.7–7.7)
Neutrophils Relative %: 81 %
Platelets: 231 K/uL (ref 150–400)
RBC: 2.28 MIL/uL — ABNORMAL LOW (ref 4.22–5.81)
RDW: 16 % — ABNORMAL HIGH (ref 11.5–15.5)
WBC: 6.5 K/uL (ref 4.0–10.5)
nRBC: 0 % (ref 0.0–0.2)

## 2024-06-28 MED ORDER — HEPARIN SOD (PORK) LOCK FLUSH 100 UNIT/ML IV SOLN
500.0000 [IU] | Freq: Once | INTRAVENOUS | Status: AC
  Administered 2024-06-28: 21:00:00 500 [IU]
  Filled 2024-06-28: qty 5

## 2024-06-28 MED ORDER — SODIUM CHLORIDE 0.9 % IV BOLUS
1000.0000 mL | Freq: Once | INTRAVENOUS | Status: AC
  Administered 2024-06-28: 19:00:00 1000 mL via INTRAVENOUS

## 2024-06-28 MED ORDER — KETOROLAC TROMETHAMINE 15 MG/ML IJ SOLN
15.0000 mg | Freq: Once | INTRAMUSCULAR | Status: AC
  Administered 2024-06-28: 16:00:00 15 mg via INTRAMUSCULAR
  Filled 2024-06-28: qty 1

## 2024-06-28 NOTE — ED Notes (Signed)
Ortho tech called for post op shoe.  

## 2024-06-28 NOTE — ED Triage Notes (Signed)
 Patient BIB [facility] from home for increased knee and foot pain after mechanical fall yesterday. Patient woke today with more pain, wanted to get checked. No swelling or deformity reported by EMS. VSS  BP 110/pal HR 92 R 18 97% RA BG 123

## 2024-06-28 NOTE — Discharge Instructions (Addendum)
 It was a pleasure taking care of you today. You were seen in the Emergency Department for evaluation of right knee and left foot pain. Your work-up was reassuring. Your x-ray does show evidence of a fracture on your left foot on the pinky side.  Therefore, we are placing you in a walking boot.  You may walk around as tolerated.  Please follow-up with orthopedics in 7 to 10 days.  Their contact information is provided on your discharge paperwork.  You will need to call them to schedule this appointment. Refer to the attached documentation for further management of your symptoms.   Please return to the ER if you experience chest pain, trouble breathing, intractable nausea/vomiting or any other life threatening illnesses.

## 2024-06-28 NOTE — ED Notes (Signed)
 [place] accessed by [employer].

## 2024-06-28 NOTE — Progress Notes (Signed)
 Orthopedic Tech Progress Note Patient Details:  [name] 06-Sep-1970 [id number]  Ortho Devices Type of Ortho Device: Postop shoe/boot Ortho Device/Splint Location: LLE/ Left at bedside Ortho Device/Splint Interventions: Ordered, Application, Adjustment   Post Interventions Patient Tolerated: Well Instructions Provided: Care of device, Adjustment of device  [name] 06/28/2024, 6:55 PM

## 2024-06-28 NOTE — ED Provider Triage Note (Signed)
 Emergency Medicine Provider Triage Evaluation Note  [name] , a 54 y.o. male  was evaluated in triage.  Pt complains of fall yesterday with associated left foot and right knee pain.  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  Last CT scan was approximately 12 days ago.  Patient reports he has been following more recently and he will get dizzy.  Patient denies any current shortness of breath or chest pain.  He does endorse some lightheadedness while sitting in the wheelchair.  Review of Systems  Positive: Fall, lightheadedness, dizziness Negative: Chest pain, shortness of breath, syncope  Physical Exam  BP 121/80 (BP Location: Right Arm)   Pulse 98   Temp 98.2 F (36.8 C) (Oral)   Resp 14   Ht 6' 1 (1.854 m)   Wt 79.4 kg   SpO2 95%   BMI 23.09 kg/m  Gen:   Awake, no distress   Resp:  Normal effort lungs are clear to auscultation all fields MSK:   Patient has pain in his left foot and right knee with some mild edema Other:    Medical Decision Making  Medically screening exam initiated at 2:13 PM.  Appropriate orders placed.  [name] was informed that the remainder of the evaluation will be completed by another provider, this initial triage assessment does not replace that evaluation, and the importance of remaining in the ED until their evaluation is complete.     [name], [place] 06/28/24 1414

## 2024-06-28 NOTE — ED Provider Notes (Addendum)
 " [place] EMERGENCY DEPARTMENT AT [facility] Provider Note   CSN: [id number] Arrival date & time: 06/28/24  1114     Patient presents with: Foot Pain and Knee Pain   [name] is a 53 y.o. male with past medical history of nasopharyngeal cancer followed by [facility], schizophrenia, hypertension, hyperlipidemia, who presents emergency department for evaluation after a fall.  Patient reports he had a mechanical fall yesterday while in the parking lot.  He denies hitting his head or losing consciousness but he reports right knee pain and left foot pain.  Patient states he initially did not want to come to the emergency department, but when he woke up this morning the pain was worse.  He denies taking any medication prior to arrival.   Foot Pain  Knee Pain      Prior to Admission medications  Medication Sig Start Date End Date Taking? Authorizing Provider  amLODipine  (NORVASC ) 10 MG tablet Take 1 tablet (10 mg total) by mouth daily. 06/21/24   Cheryle Page, MD  atorvastatin  (LIPITOR) 40 MG tablet Take 1 tablet (40 mg total) by mouth daily for cholesterol 06/21/24     benztropine  (COGENTIN ) 1 MG tablet Take 1 mg by mouth 2 (two) times daily.      [provider]  cetirizine  (ZYRTEC  ALLERGY) 10 MG tablet Take 1 tablet (10 mg total) by mouth daily as needed for allergies. 06/20/24   Cheryle Page, MD  dexamethasone  (DECADRON ) 4 MG tablet Take 2 tablets (8 mg) by mouth daily in the morning for 3 days following cisplatin  chemotherapy. 01/20/24     diphenhydrAMINE  (BENADRYL ) 25 MG tablet Take 1 tablet (25 mg total) by mouth daily. 06/20/24   Cheryle Page, MD  divalproex  (DEPAKOTE  ER) 500 MG 24 hr tablet Take 1 tablet (500 mg total) by mouth at bedtime. 06/13/24     fluticasone  (FLONASE ) 50 MCG/ACT nasal spray Place 1 spray into both nostrils daily as needed. Patient taking differently: Place 1 spray into both nostrils daily. 11/23/22     folic acid  (FOLVITE ) 1 MG tablet  Take 1 tablet (1 mg total) by mouth daily. 01/27/24     levothyroxine  (SYNTHROID ) 25 MCG tablet Take 1 tablet (25 mcg total) by mouth daily. Take on an empty stomach with a glass of water at least 30-60 minutes before breakfast. Patient taking differently: Take 25 mcg by mouth daily. 08/25/23     loperamide  (IMODIUM  A-D) 2 MG tablet Take 2 tablets (4 mg total) by mouth at first diarrhea, then one tablet after each additional diarrhea. No more than 8 tablets per day. Patient taking differently: Take 2 mg by mouth daily. 03/29/24     loratadine  (CLARITIN ) 10 MG tablet Take 1 tablet (10 mg total) by mouth daily. 06/13/24     losartan  (COZAAR ) 100 MG tablet Take 1 tablet (100 mg total) by mouth in the morning for blood pressure. 06/21/24     magnesium  oxide (MAG-OX) 400 MG tablet Take 1 tablet (400 mg total) by mouth 2 (two) times daily. 01/25/24     metoprolol  tartrate (LOPRESSOR ) 25 MG tablet Take 1 tablet (25 mg total) by mouth 2 (two) times daily. 06/20/24   Cheryle Page, MD  naproxen  sodium (ALEVE ) 220 MG tablet Take 1 tablet (220 mg total) by mouth 2 (two) times daily as needed. Patient taking differently: Take 220 mg by mouth daily as needed (pain). 04/25/21   Patt Alm Macho, MD  omeprazole  (PRILOSEC) 20 MG capsule Take 1  capsule (20 mg total) by mouth daily as needed (reflux). 06/20/24   Cheryle Page, MD  ondansetron  (ZOFRAN ) 8 MG tablet Take 1 tablet (8 mg total) by mouth every 8 (eight) hours as needed for nausea/vomiting. Patient taking differently: Take 8 mg by mouth daily as needed for nausea or vomiting. 01/27/23     Potassium Chloride  ER 20 MEQ TBCR Take 1 tablet (20 mEq total) by mouth 2 (two) times daily. 06/05/24     risperiDONE  (RISPERDAL ) 1 MG tablet Take 1 mg by mouth at bedtime.    [provider]  risperiDONE  microspheres (RISPERDAL  CONSTA) 50 MG injection Inject 50 mg into the muscle every 14 (fourteen) days. 11/16/19   [provider]  rizatriptan  (MAXALT -MLT) 10  MG disintegrating tablet Take 1 tablet (10 mg total) by mouth as needed for migraine. May repeat in 2 hours if needed 04/25/21   Patt Alm Macho, MD  hydrochlorothiazide  (HYDRODIURIL ) 12.5 MG tablet Take 1 tablet (12.5 mg total) by mouth daily as needed for leg swelling 06/15/23 12/09/23      Allergies: Patient has no known allergies.    Review of Systems  Musculoskeletal:  Positive for joint swelling.    Updated Vital Signs BP 121/89   Pulse 84   Temp 98 F (36.7 C) (Oral)   Resp 11   Ht 6' 1 (1.854 m)   Wt 79.4 kg   SpO2 100%   BMI 23.09 kg/m   Physical Exam Vitals and nursing note reviewed.  Constitutional:      Appearance: Normal appearance. He is not ill-appearing.  Eyes:     General: No scleral icterus. Pulmonary:     Effort: Pulmonary effort is normal. No respiratory distress.  Musculoskeletal:        General: Swelling and tenderness present. No deformity.     Comments: Mild swelling noted to the left lateral side of the foot.  Sensation is intact.  DP pulse intact.  Patient able to gently range his foot in all 4 directions, however does have some pain.  Patient's knee is nontender to palpation.  Skin:    Coloration: Skin is not jaundiced.  Neurological:     General: No focal deficit present.     Mental Status: He is alert.  Psychiatric:        Mood and Affect: Mood normal.     (all labs ordered are listed, but only abnormal results are displayed) Labs Reviewed  CBC WITH DIFFERENTIAL/PLATELET - Abnormal; Notable for the following components:      Result Value   RBC 2.28 (*)    Hemoglobin 7.8 (*)    HCT 22.9 (*)    MCV 100.4 (*)    MCH 34.2 (*)    RDW 16.0 (*)    Abs Immature Granulocytes 0.08 (*)    All other components within normal limits  BASIC METABOLIC PANEL WITH GFR - Abnormal; Notable for the following components:   Sodium 132 (*)    Glucose, Bld 192 (*)    Creatinine, Ser 2.14 (*)    GFR, Estimated 37 (*)    All other components within  normal limits     EKG: None  Radiology: DG Knee Complete 4 Views Right Result Date: 06/28/2024 CLINICAL DATA:  knee pain EXAM: RIGHT KNEE - COMPLETE 4+ VIEW COMPARISON:  December 29, 2017 FINDINGS: No acute fracture or dislocation. Moderate volume joint effusion. Quadriceps insertion enthesophyte. Small osteophyte formation along the medial compartment. Soft tissues are unremarkable. IMPRESSION: Moderate volume joint  effusion.  No acute fracture or dislocation. Electronically Signed   By: Rogelia Myers M.D.   On: 06/28/2024 12:41   DG Foot Complete Left Result Date: 06/28/2024 CLINICAL DATA:  96001 Foot pain 03998 EXAM: LEFT FOOT - COMPLETE 3+ VIEW COMPARISON:  None Available. FINDINGS: Mildly displaced, obliquely oriented fracture of the fifth metatarsal. There is approximately 3 mm of medial displacement of the distal fragment. No dislocation.No acute fracture or dislocation. There is no evidence of arthropathy or other focal bone abnormality. Soft tissue swelling along the lateral aspect of the foot. No radiopaque foreign body. IMPRESSION: Mildly displaced, obliquely oriented fracture of the fifth metatarsal. Electronically Signed   By: Rogelia Myers M.D.   On: 06/28/2024 12:37    Procedures   Medications Ordered in the ED  ketorolac  (TORADOL ) 15 MG/ML injection 15 mg (15 mg Intramuscular Given 06/28/24 1549)  sodium chloride  0.9 % bolus 1,000 mL (0 mLs Intravenous Stopped 06/28/24 2030)  heparin  lock flush 100 unit/mL (500 Units Intracatheter Given by Other 06/28/24 2031)                                 Medical Decision Making Amount and/or Complexity of Data Reviewed Labs: ordered. Radiology: ordered.  Risk Prescription drug management.   This patient presents to the ED for concern of right knee and left foot pain, this involves an extensive number of treatment options, and is a complaint that carries with it a high risk of complications and morbidity.  Differential diagnosis  includes: Fracture, dislocation, septic arthritis, osteoarthritis, psoriatic arthritis, muscle strain, muscle sprain  Co morbidities:  nasopharyngeal cancer, schizophrenia, hypertension, hyperlipidemia   Additional history: Patient had a recent hospital admission on 12/4 until 12/9 and was found to be pancytopenic at that time.  Patient received multiple units of PRBCs and platelets.  Lab Tests:  Lab testing are indicated at this time  Imaging Studies:  I ordered imaging studies including right knee, left foot x-ray I independently visualized and interpreted imaging which showed right knee shows mild joint effusion and left foot shows mildly displaced, obliquely oriented fracture of the fifth metatarsal  I agree with the radiologist interpretation  Cardiac Monitoring/ECG:  The patient was maintained on a cardiac monitor.  I personally viewed and interpreted the cardiac monitored which showed an underlying rhythm of: Sinus rhythm  Medicines ordered and prescription drug management:  I ordered medication including  Medications  ketorolac  (TORADOL ) 15 MG/ML injection 15 mg (15 mg Intramuscular Given 06/28/24 1549)  sodium chloride  0.9 % bolus 1,000 mL (0 mLs Intravenous Stopped 06/28/24 2030)  heparin  lock flush 100 unit/mL (500 Units Intracatheter Given by Other 06/28/24 2031)   for pain management Reevaluation of the patient after these medicines showed that the patient improved I have reviewed the patients home medicines and have made adjustments as needed  Test Considered:   none  Critical Interventions:   none  Consultations Obtained: - ortho  Problem List / ED Course:     ICD-10-CM   1. Closed displaced fracture of fifth metatarsal bone of left foot, initial encounter  S92.352A       Clinical Course as of 06/28/24 2052  Wed Jun 28, 2024  1603 Spoke with Dr. Sherida from Ortho, who recommended we place patient in a walking boot and weight-bear as tolerated.  [GD]    Clinical Course User Index [GD] Torrence Marry RAMAN, PA-C    MDM:  53 year old male who presents to the emergency department for evaluation of right knee and left foot pain after mechanical fall yesterday.  Lab work was obtained to rule out any other cause of patient's fall.  Hemoglobin remains above transfusion threshold.  IV fluids were given due to patient's elevated creatinine when compared to his labs 9 days ago.  Given oblique fracture of the fifth metatarsal, consult to orthopedics has been placed.  Recommendation for patient to be placed in a walking boot and follow-up with Ortho outpatient.  Plan for patient to follow-up with Ortho within the next 7 to 10 days.  Patient's vital signs are stable.  Patient is appropriate for discharge at this time.   Dispostion:  After consideration of the diagnostic results and the patients response to treatment, I feel that the patient would benefit from outpatient Ortho follow-up.   Final diagnoses:  Closed displaced fracture of fifth metatarsal bone of left foot, initial encounter    ED Discharge Orders     None          Torrence Marry RAMAN, PA-C 06/28/24 1947    Torrence Marry RAMAN, PA-C 06/28/24 2052    Pamella Ozell LABOR, DO 07/06/24 1240  "

## 2024-07-10 LAB — TRANSFUSION REACTION
DAT C3: NEGATIVE
Post RXN DAT IgG: NEGATIVE

## 2024-07-11 ENCOUNTER — Other Ambulatory Visit (HOSPITAL_COMMUNITY): Payer: Self-pay | Admitting: Nurse Practitioner

## 2024-07-14 ENCOUNTER — Ambulatory Visit (HOSPITAL_COMMUNITY)
Admission: RE | Admit: 2024-07-14 | Discharge: 2024-07-14 | Disposition: A | Discharge status: Home / Self Care | Source: Ambulatory Visit | Attending: Nurse Practitioner | Admitting: Nurse Practitioner

## 2024-07-14 VITALS — BP 124/87 | HR 81 | Temp 97.6°F | Resp 17

## 2024-07-14 DIAGNOSIS — C119 Malignant neoplasm of nasopharynx, unspecified: Secondary | ICD-10-CM | POA: Insufficient documentation

## 2024-07-14 MED ORDER — SODIUM CHLORIDE 0.9 % IV BOLUS
1000.0000 mL | Freq: Once | INTRAVENOUS | Status: AC
  Administered 2024-07-14: 1000 mL via INTRAVENOUS

## 2024-07-14 MED ORDER — HEPARIN SOD (PORK) LOCK FLUSH 100 UNIT/ML IV SOLN
INTRAVENOUS | Status: AC
  Filled 2024-07-14: qty 5

## 2024-07-17 ENCOUNTER — Inpatient Hospital Stay (HOSPITAL_COMMUNITY)
Admission: RE | Admit: 2024-07-17 | Discharge: 2024-07-17 | Disposition: A | Source: Ambulatory Visit | Attending: Nurse Practitioner | Admitting: Nurse Practitioner

## 2024-07-17 VITALS — BP 123/81 | HR 84 | Temp 97.7°F | Resp 16

## 2024-07-17 DIAGNOSIS — C119 Malignant neoplasm of nasopharynx, unspecified: Secondary | ICD-10-CM

## 2024-07-17 MED ORDER — HEPARIN SOD (PORK) LOCK FLUSH 100 UNIT/ML IV SOLN
INTRAVENOUS | Status: AC
  Filled 2024-07-17: qty 5

## 2024-07-17 MED ORDER — HEPARIN SOD (PORK) LOCK FLUSH 100 UNIT/ML IV SOLN
250.0000 [IU] | Freq: Once | INTRAVENOUS | Status: AC | PRN
  Administered 2024-07-17: 250 [IU]

## 2024-07-17 MED ORDER — SODIUM CHLORIDE 0.9 % IV BOLUS
1000.0000 mL | Freq: Once | INTRAVENOUS | Status: AC
  Administered 2024-07-17: 1000 mL via INTRAVENOUS

## 2024-07-24 ENCOUNTER — Other Ambulatory Visit (HOSPITAL_COMMUNITY): Payer: Self-pay

## 2024-08-02 ENCOUNTER — Other Ambulatory Visit (HOSPITAL_COMMUNITY): Payer: Self-pay | Admitting: Nurse Practitioner

## 2024-08-02 NOTE — Progress Notes (Signed)
 Received IV fluid orders  1000 mL NS on 08/03/24, 08/07/24, 08/10/24, 08/14/24, 08/17/2024 These appointments are already scheduled  Sherry Pennant, PharmD, MPH, BCPS, CPP Clinical Pharmacist

## 2024-08-03 ENCOUNTER — Ambulatory Visit

## 2024-08-03 ENCOUNTER — Encounter (HOSPITAL_COMMUNITY): Payer: Self-pay | Admitting: Nurse Practitioner

## 2024-08-03 NOTE — Addendum Note (Signed)
 Addended by: DAYNE SHERRY RAMAN on: 08/03/2024 11:42 AM   Modules accepted: Orders

## 2024-08-03 NOTE — Progress Notes (Signed)
 Received updated orders (addendum on dates)  1000 mL NS on 08/04/2024, 08/08/2024, 08/11/2024, 08/14/2024, 08/18/2024  These addended appointments are already scheduled

## 2024-08-04 ENCOUNTER — Ambulatory Visit (HOSPITAL_COMMUNITY)
Admission: RE | Admit: 2024-08-04 | Discharge: 2024-08-04 | Disposition: A | Discharge status: Home / Self Care | Source: Ambulatory Visit | Attending: Nurse Practitioner | Admitting: Nurse Practitioner

## 2024-08-04 VITALS — BP 131/95 | HR 80 | Temp 97.7°F | Resp 16

## 2024-08-04 DIAGNOSIS — R7989 Other specified abnormal findings of blood chemistry: Secondary | ICD-10-CM | POA: Insufficient documentation

## 2024-08-04 DIAGNOSIS — C119 Malignant neoplasm of nasopharynx, unspecified: Secondary | ICD-10-CM | POA: Diagnosis present

## 2024-08-04 MED ORDER — HEPARIN SOD (PORK) LOCK FLUSH 100 UNIT/ML IV SOLN
INTRAVENOUS | Status: AC
  Filled 2024-08-04: qty 5

## 2024-08-04 MED ORDER — HEPARIN SOD (PORK) LOCK FLUSH 100 UNIT/ML IV SOLN: 250.0000 [IU] | Freq: Once | INTRAVENOUS | Status: DC | PRN

## 2024-08-04 MED ORDER — SODIUM CHLORIDE 0.9 % IV BOLUS
1000.0000 mL | Freq: Once | INTRAVENOUS | Status: AC
  Administered 2024-08-04: 1000 mL via INTRAVENOUS

## 2024-08-04 MED ORDER — HEPARIN SOD (PORK) LOCK FLUSH 100 UNIT/ML IV SOLN
500.0000 [IU] | Freq: Once | INTRAVENOUS | Status: AC | PRN
  Administered 2024-08-04: 500 [IU]

## 2024-08-07 ENCOUNTER — Ambulatory Visit

## 2024-08-07 ENCOUNTER — Other Ambulatory Visit (HOSPITAL_COMMUNITY): Payer: Self-pay

## 2024-08-07 ENCOUNTER — Encounter (HOSPITAL_COMMUNITY): Payer: Self-pay | Admitting: Nurse Practitioner

## 2024-08-08 ENCOUNTER — Inpatient Hospital Stay (HOSPITAL_COMMUNITY): Admission: RE | Admit: 2024-08-08 | Source: Ambulatory Visit

## 2024-08-09 ENCOUNTER — Inpatient Hospital Stay (HOSPITAL_COMMUNITY)
Admission: RE | Admit: 2024-08-09 | Discharge: 2024-08-09 | Attending: Nurse Practitioner | Admitting: Nurse Practitioner

## 2024-08-09 VITALS — BP 157/101 | HR 100 | Temp 97.7°F | Resp 16

## 2024-08-09 DIAGNOSIS — R7989 Other specified abnormal findings of blood chemistry: Secondary | ICD-10-CM

## 2024-08-09 DIAGNOSIS — C119 Malignant neoplasm of nasopharynx, unspecified: Secondary | ICD-10-CM | POA: Diagnosis not present

## 2024-08-09 MED ORDER — SODIUM CHLORIDE 0.9 % IV BOLUS
1000.0000 mL | Freq: Once | INTRAVENOUS | Status: AC
  Administered 2024-08-09: 1000 mL via INTRAVENOUS

## 2024-08-09 MED ORDER — HEPARIN SOD (PORK) LOCK FLUSH 100 UNIT/ML IV SOLN
500.0000 [IU] | Freq: Once | INTRAVENOUS | Status: AC | PRN
  Administered 2024-08-09: 500 [IU]

## 2024-08-09 MED ORDER — HEPARIN SOD (PORK) LOCK FLUSH 100 UNIT/ML IV SOLN
INTRAVENOUS | Status: AC
  Filled 2024-08-09: qty 5

## 2024-08-10 ENCOUNTER — Ambulatory Visit

## 2024-08-11 ENCOUNTER — Ambulatory Visit (HOSPITAL_COMMUNITY)
Admission: RE | Admit: 2024-08-11 | Discharge: 2024-08-11 | Disposition: A | Source: Ambulatory Visit | Attending: Nurse Practitioner | Admitting: Nurse Practitioner

## 2024-08-11 VITALS — BP 138/105 | Temp 97.5°F | Resp 16

## 2024-08-11 DIAGNOSIS — C119 Malignant neoplasm of nasopharynx, unspecified: Secondary | ICD-10-CM

## 2024-08-11 DIAGNOSIS — R7989 Other specified abnormal findings of blood chemistry: Secondary | ICD-10-CM

## 2024-08-11 MED ORDER — HEPARIN SOD (PORK) LOCK FLUSH 100 UNIT/ML IV SOLN: 250.0000 [IU] | Freq: Once | INTRAVENOUS | Status: DC | PRN

## 2024-08-11 MED ORDER — HEPARIN SOD (PORK) LOCK FLUSH 100 UNIT/ML IV SOLN
INTRAVENOUS | Status: AC
  Filled 2024-08-11: qty 5

## 2024-08-11 MED ORDER — HEPARIN SOD (PORK) LOCK FLUSH 100 UNIT/ML IV SOLN
500.0000 [IU] | Freq: Once | INTRAVENOUS | Status: AC | PRN
  Administered 2024-08-11: 500 [IU]

## 2024-08-11 MED ORDER — SODIUM CHLORIDE 0.9 % IV BOLUS
1000.0000 mL | Freq: Once | INTRAVENOUS | Status: AC
  Administered 2024-08-11: 1000 mL via INTRAVENOUS

## 2024-08-14 ENCOUNTER — Inpatient Hospital Stay (HOSPITAL_COMMUNITY): Admission: RE | Admit: 2024-08-14 | Source: Ambulatory Visit

## 2024-08-14 ENCOUNTER — Ambulatory Visit

## 2024-08-17 ENCOUNTER — Ambulatory Visit

## 2024-08-18 ENCOUNTER — Ambulatory Visit (HOSPITAL_COMMUNITY)
Admission: RE | Admit: 2024-08-18 | Discharge: 2024-08-18 | Disposition: A | Discharge status: Home / Self Care | Source: Ambulatory Visit | Attending: Nurse Practitioner | Admitting: Nurse Practitioner

## 2024-08-18 ENCOUNTER — Ambulatory Visit

## 2024-08-18 ENCOUNTER — Telehealth (HOSPITAL_COMMUNITY): Payer: Self-pay

## 2024-08-18 VITALS — BP 145/105 | HR 75 | Temp 98.0°F | Resp 16

## 2024-08-18 DIAGNOSIS — C119 Malignant neoplasm of nasopharynx, unspecified: Secondary | ICD-10-CM

## 2024-08-18 DIAGNOSIS — R7989 Other specified abnormal findings of blood chemistry: Secondary | ICD-10-CM

## 2024-08-18 MED ORDER — HEPARIN SOD (PORK) LOCK FLUSH 100 UNIT/ML IV SOLN
500.0000 [IU] | Freq: Once | INTRAVENOUS | Status: AC | PRN
  Administered 2024-08-18: 500 [IU]

## 2024-08-18 MED ORDER — SODIUM CHLORIDE 0.9 % IV BOLUS
1000.0000 mL | Freq: Once | INTRAVENOUS | Status: AC
  Administered 2024-08-18: 1000 mL via INTRAVENOUS

## 2024-08-18 MED ORDER — HEPARIN SOD (PORK) LOCK FLUSH 100 UNIT/ML IV SOLN
INTRAVENOUS | Status: AC
  Filled 2024-08-18: qty 5

## 2024-08-18 NOTE — Telephone Encounter (Signed)
 Message left for [facility] Triage nurse re: new orders needed for pt's infusions.  Asked for orders to be faxed to [phone] or to call [phone] if any questions
